# Patient Record
Sex: Male | Born: 1955
Health system: Southern US, Community
[De-identification: ages and names within clinical notes are randomized; demographics above are authoritative.]

## PROBLEM LIST (undated history)

## (undated) DIAGNOSIS — K219 Gastro-esophageal reflux disease without esophagitis: Secondary | ICD-10-CM

## (undated) DIAGNOSIS — M1990 Unspecified osteoarthritis, unspecified site: Secondary | ICD-10-CM

## (undated) DIAGNOSIS — Z8619 Personal history of other infectious and parasitic diseases: Secondary | ICD-10-CM

## (undated) DIAGNOSIS — I2510 Atherosclerotic heart disease of native coronary artery without angina pectoris: Secondary | ICD-10-CM

## (undated) DIAGNOSIS — N289 Disorder of kidney and ureter, unspecified: Secondary | ICD-10-CM

## (undated) DIAGNOSIS — E785 Hyperlipidemia, unspecified: Secondary | ICD-10-CM

## (undated) DIAGNOSIS — I219 Acute myocardial infarction, unspecified: Secondary | ICD-10-CM

## (undated) DIAGNOSIS — I10 Essential (primary) hypertension: Secondary | ICD-10-CM

## (undated) DIAGNOSIS — N200 Calculus of kidney: Secondary | ICD-10-CM

## (undated) DIAGNOSIS — Z87442 Personal history of urinary calculi: Secondary | ICD-10-CM

## (undated) HISTORY — DX: Hyperlipidemia, unspecified: E78.5

## (undated) HISTORY — PX: CORONARY ANGIOPLASTY: SHX604

## (undated) HISTORY — PX: APPENDECTOMY: SHX54

## (undated) HISTORY — PX: CYSTOSCOPY WITH STENT PLACEMENT: SHX5790

## (undated) HISTORY — PX: BACK SURGERY: SHX140

## (undated) HISTORY — PX: TONSILLECTOMY: SUR1361

---
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
## 1999-03-05 ENCOUNTER — Emergency Department (HOSPITAL_COMMUNITY): Admission: EM | Admit: 1999-03-05 | Discharge: 1999-03-05 | Payer: Self-pay | Admitting: Emergency Medicine

## 2000-11-13 ENCOUNTER — Emergency Department (HOSPITAL_COMMUNITY): Admission: EM | Admit: 2000-11-13 | Discharge: 2000-11-13 | Payer: Self-pay | Admitting: Emergency Medicine

## 2001-09-27 ENCOUNTER — Emergency Department (HOSPITAL_COMMUNITY): Admission: EM | Admit: 2001-09-27 | Discharge: 2001-09-28 | Payer: Self-pay | Admitting: Emergency Medicine

## 2001-09-28 ENCOUNTER — Encounter: Payer: Self-pay | Admitting: Emergency Medicine

## 2001-11-11 ENCOUNTER — Ambulatory Visit (HOSPITAL_BASED_OUTPATIENT_CLINIC_OR_DEPARTMENT_OTHER): Admission: RE | Admit: 2001-11-11 | Discharge: 2001-11-11 | Payer: Self-pay | Admitting: Urology

## 2003-04-27 ENCOUNTER — Ambulatory Visit (HOSPITAL_COMMUNITY): Admission: RE | Admit: 2003-04-27 | Discharge: 2003-04-27 | Payer: Self-pay | Admitting: Gastroenterology

## 2005-03-24 HISTORY — PX: BACK SURGERY: SHX140

## 2005-08-01 ENCOUNTER — Ambulatory Visit (HOSPITAL_COMMUNITY): Admission: RE | Admit: 2005-08-01 | Discharge: 2005-08-01 | Payer: Self-pay | Admitting: Neurosurgery

## 2009-03-24 DIAGNOSIS — I219 Acute myocardial infarction, unspecified: Secondary | ICD-10-CM | POA: Insufficient documentation

## 2009-03-24 HISTORY — PX: CORONARY STENT PLACEMENT: SHX1402

## 2009-03-24 HISTORY — DX: Acute myocardial infarction, unspecified: I21.9

## 2011-03-10 ENCOUNTER — Telehealth (INDEPENDENT_AMBULATORY_CARE_PROVIDER_SITE_OTHER): Payer: Self-pay | Admitting: General Surgery

## 2011-03-10 NOTE — Telephone Encounter (Signed)
Disregard prior encounter notated. Placed in chart in error. DOB from caller does not match this chart.

## 2011-03-10 NOTE — Telephone Encounter (Signed)
Pt called in stating over a month ago he had a fill and has not been feeling well. Pt was placed on hold to pull up info and he hung up before any appointment or further information could be obtained.

## 2014-07-15 ENCOUNTER — Encounter (HOSPITAL_BASED_OUTPATIENT_CLINIC_OR_DEPARTMENT_OTHER): Payer: Self-pay

## 2014-07-15 ENCOUNTER — Emergency Department (HOSPITAL_BASED_OUTPATIENT_CLINIC_OR_DEPARTMENT_OTHER): Payer: 59

## 2014-07-15 ENCOUNTER — Emergency Department (HOSPITAL_BASED_OUTPATIENT_CLINIC_OR_DEPARTMENT_OTHER)
Admission: EM | Admit: 2014-07-15 | Discharge: 2014-07-15 | Disposition: A | Discharge status: Home / Self Care | Payer: 59 | Attending: Emergency Medicine | Admitting: Emergency Medicine

## 2014-07-15 DIAGNOSIS — I252 Old myocardial infarction: Secondary | ICD-10-CM | POA: Insufficient documentation

## 2014-07-15 DIAGNOSIS — I2510 Atherosclerotic heart disease of native coronary artery without angina pectoris: Secondary | ICD-10-CM | POA: Insufficient documentation

## 2014-07-15 DIAGNOSIS — R110 Nausea: Secondary | ICD-10-CM | POA: Diagnosis not present

## 2014-07-15 DIAGNOSIS — Z9089 Acquired absence of other organs: Secondary | ICD-10-CM | POA: Diagnosis not present

## 2014-07-15 DIAGNOSIS — R6883 Chills (without fever): Secondary | ICD-10-CM | POA: Insufficient documentation

## 2014-07-15 DIAGNOSIS — Z87442 Personal history of urinary calculi: Secondary | ICD-10-CM | POA: Diagnosis not present

## 2014-07-15 DIAGNOSIS — I10 Essential (primary) hypertension: Secondary | ICD-10-CM | POA: Diagnosis not present

## 2014-07-15 DIAGNOSIS — R109 Unspecified abdominal pain: Secondary | ICD-10-CM | POA: Diagnosis not present

## 2014-07-15 DIAGNOSIS — Z720 Tobacco use: Secondary | ICD-10-CM | POA: Insufficient documentation

## 2014-07-15 DIAGNOSIS — Z87448 Personal history of other diseases of urinary system: Secondary | ICD-10-CM | POA: Diagnosis not present

## 2014-07-15 HISTORY — DX: Calculus of kidney: N20.0

## 2014-07-15 HISTORY — DX: Disorder of kidney and ureter, unspecified: N28.9

## 2014-07-15 HISTORY — DX: Essential (primary) hypertension: I10

## 2014-07-15 HISTORY — DX: Atherosclerotic heart disease of native coronary artery without angina pectoris: I25.10

## 2014-07-15 HISTORY — DX: Acute myocardial infarction, unspecified: I21.9

## 2014-07-15 LAB — URINALYSIS, ROUTINE W REFLEX MICROSCOPIC
BILIRUBIN URINE: NEGATIVE
Glucose, UA: NEGATIVE mg/dL
Ketones, ur: 15 mg/dL — AB
Nitrite: NEGATIVE
Protein, ur: 30 mg/dL — AB
Specific Gravity, Urine: 1.027 (ref 1.005–1.030)
Urobilinogen, UA: 0.2 mg/dL (ref 0.0–1.0)
pH: 5.5 (ref 5.0–8.0)

## 2014-07-15 LAB — URINE MICROSCOPIC-ADD ON

## 2014-07-15 MED ORDER — KETOROLAC TROMETHAMINE 30 MG/ML IJ SOLN
30.0000 mg | Freq: Once | INTRAMUSCULAR | Status: AC
  Administered 2014-07-15: 30 mg via INTRAVENOUS
  Filled 2014-07-15: qty 1

## 2014-07-15 MED ORDER — TAMSULOSIN HCL 0.4 MG PO CAPS: 0.4000 mg | ORAL_CAPSULE | Freq: Every day | ORAL | Status: DC

## 2014-07-15 MED ORDER — HYDROMORPHONE HCL 1 MG/ML IJ SOLN
1.0000 mg | Freq: Once | INTRAMUSCULAR | Status: AC
  Administered 2014-07-15: 1 mg via INTRAVENOUS
  Filled 2014-07-15: qty 1

## 2014-07-15 MED ORDER — SODIUM CHLORIDE 0.9 % IV BOLUS (SEPSIS)
1000.0000 mL | Freq: Once | INTRAVENOUS | Status: AC
  Administered 2014-07-15: 1000 mL via INTRAVENOUS

## 2014-07-15 MED ORDER — HYDROCODONE-ACETAMINOPHEN 5-325 MG PO TABS: 1.0000 | ORAL_TABLET | ORAL | Status: DC | PRN

## 2014-07-15 NOTE — ED Notes (Signed)
PT reports left flank pain x3 hours with nausea, reports history of renal stones with dark urine today.

## 2014-07-15 NOTE — ED Provider Notes (Signed)
CSN: [id number]     Arrival date & time 07/15/14  1525 History  This chart was scribed for Gerhard Munch, MD by [place] Peace, ED Scribe. The patient was seen in MH11/[facility]11. The patient's care was started at 5:46 PM.     Chief Complaint  Patient presents with  . Flank Pain      Patient is a 59 y.o. male presenting with flank pain. The history is provided by the patient. No language interpreter was used.  Flank Pain This is a new problem. The current episode started 3 to 5 hours ago. The problem occurs constantly. The problem has been gradually worsening.  HPI Comments: Darryl Ramsey is a 59 y.o. male who presents to the Emergency Department complaining of non-radiating left flank pain onset 4-5 hrs ago that has worsened within the past 2 hrs. Pt also complains of nausea and chills. He notes new "dark" color change in urine as well. No complaints of fever, vomiting, diarrhea. History of kidney stones in the past (most recent episode- 1 year ago). History of heart attack and hypertension.    Past Medical History  Diagnosis Date  . Coronary artery disease   . Hypertension   . Myocardial infarct   . Renal disorder   . Renal calculi    Past Surgical History  Procedure Laterality Date  . Appendectomy    . Tonsillectomy    . Back surgery     History reviewed. No pertinent family history. History  Substance Use Topics  . Smoking status: Current Some Day Smoker  . Smokeless tobacco: Not on file  . Alcohol Use: No    Review of Systems  Constitutional: Positive for chills. Negative for fever.       Per HPI, otherwise negative  HENT:       Per HPI, otherwise negative  Respiratory:       Per HPI, otherwise negative  Cardiovascular:       Per HPI, otherwise negative  Gastrointestinal: Positive for nausea. Negative for vomiting.  Endocrine:       Negative aside from HPI  Genitourinary: Positive for flank pain.       "Dark" color change in urine. Neg aside from HPI    Musculoskeletal:       Per HPI, otherwise negative  Skin: Negative.   Neurological: Negative for syncope.      Allergies  Review of patient's allergies indicates no known allergies.  Home Medications   Prior to Admission medications   Not on File   BP 179/94 mmHg  Pulse 72  Temp(Src) 97.8 F (36.6 C) (Oral)  Resp 22  Ht 6' (1.829 m)  Wt 228 lb (103.42 kg)  BMI 30.92 kg/m2  SpO2 98% Physical Exam  Constitutional: He is oriented to person, place, and time. He appears well-developed. No distress.  HENT:  Head: Normocephalic and atraumatic.  Eyes: Conjunctivae and EOM are normal.  Cardiovascular: Normal rate, regular rhythm and normal heart sounds.  Exam reveals no gallop and no friction rub.   No murmur heard. Pulmonary/Chest: Effort normal and breath sounds normal. No stridor. No respiratory distress.  Abdominal: He exhibits no distension. There is no tenderness.  Musculoskeletal: He exhibits no edema.  Neurological: He is alert and oriented to person, place, and time.  Skin: Skin is warm and dry.  Psychiatric: He has a normal mood and affect.  Nursing note and vitals reviewed.   ED Course  Procedures (including critical care time) Labs Review Labs Reviewed  URINALYSIS, ROUTINE W REFLEX MICROSCOPIC - Abnormal; Notable for the following:    Color, Urine AMBER (*)    APPearance CLOUDY (*)    Hgb urine dipstick LARGE (*)    Ketones, ur 15 (*)    Protein, ur 30 (*)    Leukocytes, UA TRACE (*)    All other components within normal limits  URINE MICROSCOPIC-ADD ON - Abnormal; Notable for the following:    Bacteria, UA FEW (*)    Crystals CA OXALATE CRYSTALS (*)    All other components within normal limits    Imaging Review US Renal  07/15/2014   CLINICAL DATA:  LEFT flank pain. Sudden onset at 12:30 p.m. today. Urolithiasis.  EXAM: RENAL/URINARY TRACT ULTRASOUND COMPLETE  COMPARISON:  None.  FINDINGS: Right Kidney:  Length: 10.2 cm. Negative for hydronephrosis.  No calculi. Small hypoechoic area extends off the inferior pole of the kidney measuring 15 mm x 8 mm x 9 mm. This has some internal echoes and probably represents a mildly complex cyst.  Left Kidney:  Length: 11.1 cm. No hydronephrosis. No definite calculi. Hyperechoic lesion is present in the upper pole near a calyx measuring 10 mm x 6 mm x 11 mm. This may represent a proteinaceous or debris-filled cyst.  Bladder:  Bladder is partially collapsed.  Ureteral jets are visible.  IMPRESSION: No visible urolithiasis or hydronephrosis. Mildly complex cystic lesions in both kidneys. Follow-up 6 month renal ultrasound recommended to assess for stability.   Electronically Signed   By: Andreas Newport M.D.   On: 07/15/2014 18:43    Medications  sodium chloride 0.9 % bolus 1,000 mL (1,000 mLs Intravenous New Bag/Given 07/15/14 1754)  ketorolac (TORADOL) 30 MG/ML injection 30 mg (30 mg Intravenous Given 07/15/14 1754)  HYDROmorphone (DILAUDID) injection 1 mg (1 mg Intravenous Given 07/15/14 1755)    5:48 PM- Treatment plan was discussed with patient who verbalizes understanding and agrees.    7:11 PM Patient resting calm only. We discussed all findings. He will follow-up with urology.  MDM    I personally performed the services described in this documentation, which was scribed in my presence. The recorded information has been reviewed and is accurate.   This patient with a history of kidney stones presents after the acute onset of left-sided flank pain. Given the patient's prior diagnosis, ultrasound was performed in addition to urinalysis. Patient received fluid resuscitation, analgesics, anti-inflammatories per Pain resolved entirely. No evidence for ongoing infection, complete obstruction. Ultrasound did not demonstrate obvious stone, but given the hematuria, the pain, kidney stone is likely causative. Patient discharged in stable condition to follow-up urology.   Gerhard Munch, MD 07/15/14  [phone]

## 2014-07-15 NOTE — ED Notes (Signed)
Spoke w/ pt in waiting area, asked pt to attempt to void to obtain urine specimen to help expedite his care. Pt walking around in waiting room. Pt agreed w/ current care being provided

## 2014-07-15 NOTE — Discharge Instructions (Signed)
As discussed, your evaluation has demonstrated the likely presence of a kidney stone causing your flank pain. It is important to take all medication as directed, monitor your condition carefully, and be sure to return here for concerning changes in your condition.

## 2014-07-17 ENCOUNTER — Emergency Department (HOSPITAL_BASED_OUTPATIENT_CLINIC_OR_DEPARTMENT_OTHER): Payer: 59

## 2014-07-17 ENCOUNTER — Emergency Department (HOSPITAL_BASED_OUTPATIENT_CLINIC_OR_DEPARTMENT_OTHER)
Admission: EM | Admit: 2014-07-17 | Discharge: 2014-07-18 | Disposition: A | Discharge status: Home / Self Care | Payer: 59 | Attending: Emergency Medicine | Admitting: Emergency Medicine

## 2014-07-17 ENCOUNTER — Encounter (HOSPITAL_BASED_OUTPATIENT_CLINIC_OR_DEPARTMENT_OTHER): Payer: Self-pay | Admitting: *Deleted

## 2014-07-17 DIAGNOSIS — Z87442 Personal history of urinary calculi: Secondary | ICD-10-CM | POA: Insufficient documentation

## 2014-07-17 DIAGNOSIS — Z79899 Other long term (current) drug therapy: Secondary | ICD-10-CM | POA: Insufficient documentation

## 2014-07-17 DIAGNOSIS — R109 Unspecified abdominal pain: Secondary | ICD-10-CM | POA: Diagnosis present

## 2014-07-17 DIAGNOSIS — I252 Old myocardial infarction: Secondary | ICD-10-CM | POA: Insufficient documentation

## 2014-07-17 DIAGNOSIS — N23 Unspecified renal colic: Secondary | ICD-10-CM | POA: Diagnosis not present

## 2014-07-17 DIAGNOSIS — Z720 Tobacco use: Secondary | ICD-10-CM | POA: Insufficient documentation

## 2014-07-17 DIAGNOSIS — I2510 Atherosclerotic heart disease of native coronary artery without angina pectoris: Secondary | ICD-10-CM | POA: Diagnosis not present

## 2014-07-17 DIAGNOSIS — I10 Essential (primary) hypertension: Secondary | ICD-10-CM | POA: Insufficient documentation

## 2014-07-17 LAB — URINE MICROSCOPIC-ADD ON

## 2014-07-17 LAB — URINALYSIS, ROUTINE W REFLEX MICROSCOPIC
Bilirubin Urine: NEGATIVE
Glucose, UA: NEGATIVE mg/dL
Ketones, ur: NEGATIVE mg/dL
LEUKOCYTES UA: NEGATIVE
Nitrite: NEGATIVE
Protein, ur: NEGATIVE mg/dL
Specific Gravity, Urine: 1.031 — ABNORMAL HIGH (ref 1.005–1.030)
Urobilinogen, UA: 1 mg/dL (ref 0.0–1.0)
pH: 5.5 (ref 5.0–8.0)

## 2014-07-17 MED ORDER — KETOROLAC TROMETHAMINE 15 MG/ML IJ SOLN
15.0000 mg | Freq: Once | INTRAMUSCULAR | Status: AC
  Administered 2014-07-17: 15 mg via INTRAVENOUS
  Filled 2014-07-17: qty 1

## 2014-07-17 MED ORDER — HYDROMORPHONE HCL 1 MG/ML IJ SOLN
1.0000 mg | Freq: Once | INTRAMUSCULAR | Status: AC
  Administered 2014-07-17: 1 mg via INTRAVENOUS
  Filled 2014-07-17: qty 1

## 2014-07-17 MED ORDER — ONDANSETRON HCL 4 MG/2ML IJ SOLN
4.0000 mg | Freq: Once | INTRAMUSCULAR | Status: AC
  Administered 2014-07-17: 4 mg via INTRAVENOUS
  Filled 2014-07-17: qty 2

## 2014-07-17 MED ORDER — SODIUM CHLORIDE 0.9 % IV SOLN
INTRAVENOUS | Status: DC
  Administered 2014-07-17: 1000 mL via INTRAVENOUS

## 2014-07-17 NOTE — ED Notes (Signed)
Left flank pain. Possible kidney stone.

## 2014-07-17 NOTE — ED Notes (Signed)
Left flank pain onset 2130 this pm,  Was seen Saturday for same

## 2014-07-17 NOTE — ED Provider Notes (Addendum)
CSN: [id number]     Arrival date & time 07/17/14  2151 History  This chart was scribed for Paula Libra, MD by Ronney Lion, ED Scribe. This patient was seen in room MH06/MH06 and the patient's care was started at 11:22 PM.    Chief Complaint  Patient presents with  . Flank Pain   The history is provided by a relative. No language interpreter was used.    HPI Comments: Darryl Ramsey is a 59 y.o. male who presents to the Emergency Department complaining of constant, severe, non-radiating left flank pain that began about 3 hours ago. He has had some associated nausea but no vomiting. He denies fever or gross hematuria. Per daughter, patient was evaluated in the ED about 2 days ago for the same symptoms that began the same day. A renal ultrasound was nondiagnostic and he was discharged with Flomax and 10 tablets of Vicodin, which provided no relief. He was also told to follow-up with [facility], but his daughter called them and they are unable to see him until next week.   Past Medical History  Diagnosis Date  . Coronary artery disease   . Hypertension   . Myocardial infarct   . Renal disorder   . Renal calculi    Past Surgical History  Procedure Laterality Date  . Appendectomy    . Tonsillectomy    . Back surgery     No family history on file. History  Substance Use Topics  . Smoking status: Current Some Day Smoker  . Smokeless tobacco: Not on file  . Alcohol Use: No    Review of Systems  A complete 10 system review of systems was obtained and all systems are negative except as noted in the HPI and PMH.    Allergies  Review of patient's allergies indicates no known allergies.  Home Medications   Prior to Admission medications   Medication Sig Start Date End Date Taking? Authorizing Provider  HYDROcodone-acetaminophen (NORCO/VICODIN) 5-325 MG per tablet Take 1 tablet by mouth every 4 (four) hours as needed for severe pain. 07/15/14   Gerhard Munch, MD  tamsulosin  (FLOMAX) 0.4 MG CAPS capsule Take 1 capsule (0.4 mg total) by mouth daily. 07/15/14   Gerhard Munch, MD   BP 155/95 mmHg  Pulse 86  Temp(Src) 97.7 F (36.5 C) (Oral)  Resp 16  Ht 6' (1.829 m)  Wt 228 lb (103.42 kg)  BMI 30.92 kg/m2  SpO2 96%   Physical Exam  Nursing note and vitals reviewed. General: Well-developed, well-nourished male in apparent discomfort; appearance consistent with age of record HENT: normocephalic; atraumatic Eyes: pupils equal, round and reactive to light; extraocular muscles intact Neck: supple Heart: regular rate and rhythm Lungs: clear to auscultation bilaterally Abdomen: soft; nondistended; nontender; no masses or hepatosplenomegaly; bowel sounds present Genitourinary: Mild left CVA tenderness Extremities: No deformity; full range of motion; pulses normal Neurologic: Awake, alert and oriented; motor function intact in all extremities and symmetric; no facial droop Skin: Warm and dry Psychiatric: Normal mood and affect  ED Course  Procedures (including critical care time)  DIAGNOSTIC STUDIES: Oxygen Saturation is 96% on room air, normal by my interpretation.    COORDINATION OF CARE: 11:27 PM - Discussed treatment plan with pt at bedside which includes pain medications and CT scan, and pt agreed to plan.   MDM   Nursing notes and vitals signs, including pulse oximetry, reviewed.  Summary of this visit's results, reviewed by myself:  Labs:  Results for  orders placed or performed during the hospital encounter of 07/17/14 (from the past 24 hour(s))  Urinalysis, Routine w reflex microscopic     Status: Abnormal   Collection Time: 07/17/14 10:00 PM  Result Value Ref Range   Color, Urine YELLOW YELLOW   APPearance CLEAR CLEAR   Specific Gravity, Urine 1.031 (H) 1.005 - 1.030   pH 5.5 5.0 - 8.0   Glucose, UA NEGATIVE NEGATIVE mg/dL   Hgb urine dipstick MODERATE (A) NEGATIVE   Bilirubin Urine NEGATIVE NEGATIVE   Ketones, ur NEGATIVE NEGATIVE  mg/dL   Protein, ur NEGATIVE NEGATIVE mg/dL   Urobilinogen, UA 1.0 0.0 - 1.0 mg/dL   Nitrite NEGATIVE NEGATIVE   Leukocytes, UA NEGATIVE NEGATIVE  Urine microscopic-add on     Status: None   Collection Time: 07/17/14 10:00 PM  Result Value Ref Range   Squamous Epithelial / LPF RARE RARE   RBC / HPF 7-10 <3 RBC/hpf   Bacteria, UA RARE RARE    Imaging Studies: Ct Renal Stone Study  07/18/2014   CLINICAL DATA:  LEFT flank pain for 3 hours, hematuria. History of appendectomy, cholecystectomy, renal disorder and renal calculi.  EXAM: CT ABDOMEN AND PELVIS WITHOUT CONTRAST  TECHNIQUE: Multidetector CT imaging of the abdomen and pelvis was performed following the standard protocol without IV contrast.  COMPARISON:  Renal ultrasound July 15, 2014  FINDINGS: LUNG BASES: Included view of the lung bases demonstrate dependent atelectasis. The visualized heart and pericardium are unremarkable.  KIDNEYS/BLADDER: Kidneys are orthotopic, demonstrating normal size and morphology. Mild LEFT hydroureteronephrosis to the level of the distal ureter were 2 calculi are seen, largest measuring 6 mm. Residual 3 mm LEFT lower pole nephrolithiasis. Limited assessment for renal masses on this nonenhanced examination. Exophytic 16 mm RIGHT interpolar cyst. The unopacified ureters are normal in course and caliber. Urinary bladder is partially distended and unremarkable.  SOLID ORGANS: The liver, spleen, gallbladder, pancreas and adrenal glands are unremarkable for this non-contrast examination.  GASTROINTESTINAL TRACT: Tiny hiatal hernia. The stomach, small and large bowel are normal in course and caliber without inflammatory changes, the sensitivity may be decreased by lack of enteric contrast. Moderate descending colonic and sigmoid diverticulosis. Nonvisualized appendix consistent with provided surgical history.  PERITONEUM/RETROPERITONEUM: Aortoiliac vessels are normal in course and overall caliber with mild ectasia of the  infrarenal aorta to 2.3 cm, mild to moderate calcific atherosclerosis. Mild misty mesentery with small lymph nodes are likely reactive. No lymphadenopathy by CT size criteria. Prostate size appears normal. No intraperitoneal free fluid nor free air.  SOFT TISSUES/ OSSEOUS STRUCTURES: Nonsuspicious. Moderate wide necked fat containing umbilical hernia. Moderate LEFT greater than RIGHT fat containing inguinal hernias. Moderate to severe degenerative change of the RIGHT hip. Moderate to severe L5-S1 degenerative disc with resultant moderate to severe RIGHT L5-S1 neural foraminal narrowing.  IMPRESSION: Mild LEFT hydroureteronephrosis to the level of the distal ureter were 2 calculi are seen measuring up to 6 mm. Residual 3 mm LEFT lower pole nephrolithiasis.  Moderate colonic diverticulosis without CT findings of acute diverticulitis.   Electronically Signed   By: Awilda Metro   On: 07/18/2014 00:56   1:00 AM Pain well controlled with IV medications.  I personally performed the services described in this documentation, which was scribed in my presence. The recorded information has been reviewed and is accurate.   Paula Libra, MD 07/18/14 [id number]  Paula Libra, MD 07/18/14 [id number]

## 2014-07-18 ENCOUNTER — Other Ambulatory Visit: Payer: Self-pay | Admitting: Urology

## 2014-07-18 MED ORDER — HYDROMORPHONE HCL 4 MG PO TABS: 2.0000 mg | ORAL_TABLET | ORAL | Status: DC | PRN

## 2014-07-18 MED ORDER — ONDANSETRON 8 MG PO TBDP: 8.0000 mg | ORAL_TABLET | Freq: Three times a day (TID) | ORAL | Status: DC | PRN

## 2014-07-21 ENCOUNTER — Encounter (HOSPITAL_COMMUNITY): Payer: Self-pay | Admitting: *Deleted

## 2014-07-24 ENCOUNTER — Encounter (HOSPITAL_COMMUNITY): Payer: Self-pay | Admitting: General Practice

## 2014-07-24 ENCOUNTER — Encounter (HOSPITAL_COMMUNITY)
Admission: RE | Disposition: A | Discharge status: Home / Self Care | Payer: Self-pay | Source: Ambulatory Visit | Attending: Urology

## 2014-07-24 ENCOUNTER — Ambulatory Visit (HOSPITAL_COMMUNITY): Payer: 59

## 2014-07-24 ENCOUNTER — Ambulatory Visit (HOSPITAL_COMMUNITY)
Admission: RE | Admit: 2014-07-24 | Discharge: 2014-07-24 | Disposition: A | Discharge status: Home / Self Care | Payer: 59 | Source: Ambulatory Visit | Attending: Urology | Admitting: Urology

## 2014-07-24 DIAGNOSIS — N201 Calculus of ureter: Secondary | ICD-10-CM | POA: Diagnosis present

## 2014-07-24 DIAGNOSIS — Z9049 Acquired absence of other specified parts of digestive tract: Secondary | ICD-10-CM | POA: Diagnosis not present

## 2014-07-24 DIAGNOSIS — I252 Old myocardial infarction: Secondary | ICD-10-CM | POA: Insufficient documentation

## 2014-07-24 DIAGNOSIS — I10 Essential (primary) hypertension: Secondary | ICD-10-CM | POA: Diagnosis not present

## 2014-07-24 DIAGNOSIS — I2510 Atherosclerotic heart disease of native coronary artery without angina pectoris: Secondary | ICD-10-CM | POA: Insufficient documentation

## 2014-07-24 DIAGNOSIS — F17210 Nicotine dependence, cigarettes, uncomplicated: Secondary | ICD-10-CM | POA: Insufficient documentation

## 2014-07-24 DIAGNOSIS — Z955 Presence of coronary angioplasty implant and graft: Secondary | ICD-10-CM | POA: Insufficient documentation

## 2014-07-24 SURGERY — LITHOTRIPSY, ESWL
Anesthesia: LOCAL | Laterality: Left

## 2014-07-24 MED ORDER — DIPHENHYDRAMINE HCL 25 MG PO CAPS
25.0000 mg | ORAL_CAPSULE | ORAL | Status: AC
  Administered 2014-07-24: 25 mg via ORAL
  Filled 2014-07-24: qty 1

## 2014-07-24 MED ORDER — CIPROFLOXACIN HCL 500 MG PO TABS
500.0000 mg | ORAL_TABLET | ORAL | Status: AC
  Administered 2014-07-24: 500 mg via ORAL
  Filled 2014-07-24: qty 1

## 2014-07-24 MED ORDER — DIAZEPAM 5 MG PO TABS
10.0000 mg | ORAL_TABLET | ORAL | Status: AC
  Administered 2014-07-24: 10 mg via ORAL
  Filled 2014-07-24: qty 2

## 2014-07-24 MED ORDER — SODIUM CHLORIDE 0.9 % IV SOLN
INTRAVENOUS | Status: DC
  Administered 2014-07-24: 13:00:00 via INTRAVENOUS

## 2014-07-24 NOTE — Brief Op Note (Signed)
07/24/2014  2:27 PM  PATIENT:  Darryl Ramsey  59 y.o. male  PRE-OPERATIVE DIAGNOSIS:  left ureteral stone   POST-OPERATIVE DIAGNOSIS:  * No post-op diagnosis entered *  PROCEDURE:  Procedure(s): LEFT EXTRACORPOREAL SHOCK WAVE LITHOTRIPSY (ESWL) (Left)  SURGEON:  Surgeon(s) and Role:    * Sebastian Ache, MD - Primary  PHYSICIAN ASSISTANT:   ASSISTANTS: none   ANESTHESIA:   MAC  EBL:     BLOOD ADMINISTERED:none  DRAINS: none   LOCAL MEDICATIONS USED:  NONE  SPECIMEN:  No Specimen  DISPOSITION OF SPECIMEN:  N/A  COUNTS:  YES  TOURNIQUET:  * No tourniquets in log *  DICTATION: .Note written in paper chart  PLAN OF CARE: Discharge to home after PACU  PATIENT DISPOSITION:  Short Stay   Delay start of Pharmacological VTE agent (>24hrs) due to surgical blood loss or risk of bleeding: yes

## 2014-07-24 NOTE — H&P (Signed)
Darryl Ramsey is an 59 y.o. male.    Chief Complaint: Pre-OP Left Shockwave Lithotripsy  HPI:   1 - Left Ureteral Stone - Left 49mm distal stone by ER CT on eval colicky flank pain. Stone is 46mm, 11cm SSD, 700HU and visible on scout images just above left femoral head most medial calcification.  NO interval fevers or stone passage.  Today Yanni presents for elective left shockwave lithotripsy. He normally follows with Dr. Marlou Porch.   Past Medical History  Diagnosis Date  . Coronary artery disease   . Hypertension   . Myocardial infarct   . Renal disorder     left ureteral stone  . Renal calculi     Past Surgical History  Procedure Laterality Date  . Appendectomy    . Tonsillectomy    . Back surgery    . Coronary stent placement  2011    patient says he had an MI and coronary stent 2011. No coronary problems since  . Cystoscopy with stent placement  2006 and in 80's    No family history on file. Social History:  reports that he has been smoking Cigarettes.  He does not have any smokeless tobacco history on file. He reports that he drinks alcohol. He reports that he does not use illicit drugs.  Allergies: No Known Allergies  No prescriptions prior to admission    No results found for this or any previous visit (from the past 48 hour(s)). No results found.  Review of Systems  Constitutional: Negative.  Negative for fever, chills and malaise/fatigue.  HENT: Negative.   Eyes: Negative.   Respiratory: Negative.   Cardiovascular: Negative.   Gastrointestinal: Negative.   Genitourinary: Positive for flank pain.  Musculoskeletal: Negative.   Skin: Negative.   Neurological: Negative.   Endo/Heme/Allergies: Negative.   Psychiatric/Behavioral: Negative.     There were no vitals taken for this visit. Physical Exam  Constitutional: He appears well-developed.  HENT:  Head: Normocephalic.  Eyes: Pupils are equal, round, and reactive to light.  Neck: Normal range of  motion.  Cardiovascular: Normal rate.   Respiratory: Effort normal.  GI: Soft.  Genitourinary:  Mild left CVAT  Musculoskeletal: Normal range of motion.  Neurological: He is alert.  Skin: Skin is warm.  Psychiatric: He has a normal mood and affect. His behavior is normal. Judgment and thought content normal.     Assessment/Plan  1 - Left Ureteral Stone - risks and benefits previousl discussed last office visity with Dr. Marlou Porch.  Also, we discussed shockwave lithotripsy in detail as well as my "rule of 9s" with stones <33mm, less than 900 HU, and skin to stone distance <9cm having approximately 90% treatment success with single session of treatment. We then addressed how stones that are larger, more dense, and in patients with less favorable anatomy have incrementally decreased success rates. We discussed risks including, bleeding, infection, hematoma, loss of kidney, need for staged therapy, need for adjunctive therapy and requirement to refrain from any anticoagulants, anti-platelet or aspirin-like products peri-procedureally. After careful consideration, the patient has chosen to proceed.   William Schake 07/24/2014, 5:58 AM

## 2014-07-24 NOTE — Discharge Instructions (Signed)
1 - You may have urinary urgency (bladder spasms), bloody urine on / off, and pass small stone fragments for up to 2 weeks. This is normal. ° °2 - Call MD or go to ER for fever >102, severe pain / nausea / vomiting not relieved by medications, or acute change in medical status ° °

## 2014-12-04 ENCOUNTER — Emergency Department (HOSPITAL_BASED_OUTPATIENT_CLINIC_OR_DEPARTMENT_OTHER)
Admission: EM | Admit: 2014-12-04 | Discharge: 2014-12-04 | Disposition: A | Discharge status: Home / Self Care | Payer: 59 | Attending: Emergency Medicine | Admitting: Emergency Medicine

## 2014-12-04 ENCOUNTER — Encounter (HOSPITAL_BASED_OUTPATIENT_CLINIC_OR_DEPARTMENT_OTHER): Payer: Self-pay | Admitting: *Deleted

## 2014-12-04 DIAGNOSIS — R251 Tremor, unspecified: Secondary | ICD-10-CM | POA: Diagnosis not present

## 2014-12-04 DIAGNOSIS — I252 Old myocardial infarction: Secondary | ICD-10-CM | POA: Insufficient documentation

## 2014-12-04 DIAGNOSIS — T43615A Adverse effect of caffeine, initial encounter: Secondary | ICD-10-CM | POA: Insufficient documentation

## 2014-12-04 DIAGNOSIS — Z87448 Personal history of other diseases of urinary system: Secondary | ICD-10-CM | POA: Diagnosis not present

## 2014-12-04 DIAGNOSIS — G8929 Other chronic pain: Secondary | ICD-10-CM | POA: Diagnosis not present

## 2014-12-04 DIAGNOSIS — I10 Essential (primary) hypertension: Secondary | ICD-10-CM | POA: Insufficient documentation

## 2014-12-04 DIAGNOSIS — Z79899 Other long term (current) drug therapy: Secondary | ICD-10-CM | POA: Diagnosis not present

## 2014-12-04 DIAGNOSIS — Z87442 Personal history of urinary calculi: Secondary | ICD-10-CM | POA: Insufficient documentation

## 2014-12-04 DIAGNOSIS — Z720 Tobacco use: Secondary | ICD-10-CM | POA: Diagnosis not present

## 2014-12-04 DIAGNOSIS — I2510 Atherosclerotic heart disease of native coronary artery without angina pectoris: Secondary | ICD-10-CM | POA: Diagnosis not present

## 2014-12-04 DIAGNOSIS — R42 Dizziness and giddiness: Secondary | ICD-10-CM | POA: Diagnosis present

## 2014-12-04 LAB — CBC WITH DIFFERENTIAL/PLATELET
BASOS PCT: 1 % (ref 0–1)
Basophils Absolute: 0.1 10*3/uL (ref 0.0–0.1)
Eosinophils Absolute: 0.2 10*3/uL (ref 0.0–0.7)
Eosinophils Relative: 2 % (ref 0–5)
HEMATOCRIT: 39.2 % (ref 39.0–52.0)
HEMOGLOBIN: 13.4 g/dL (ref 13.0–17.0)
LYMPHS ABS: 1.5 10*3/uL (ref 0.7–4.0)
Lymphocytes Relative: 16 % (ref 12–46)
MCH: 31.5 pg (ref 26.0–34.0)
MCHC: 34.2 g/dL (ref 30.0–36.0)
MCV: 92 fL (ref 78.0–100.0)
MONOS PCT: 9 % (ref 3–12)
Monocytes Absolute: 0.9 10*3/uL (ref 0.1–1.0)
NEUTROS ABS: 6.7 10*3/uL (ref 1.7–7.7)
NEUTROS PCT: 72 % (ref 43–77)
Platelets: 249 10*3/uL (ref 150–400)
RBC: 4.26 MIL/uL (ref 4.22–5.81)
RDW: 13 % (ref 11.5–15.5)
WBC: 9.4 10*3/uL (ref 4.0–10.5)

## 2014-12-04 LAB — BASIC METABOLIC PANEL
Anion gap: 9 (ref 5–15)
BUN: 17 mg/dL (ref 6–20)
CHLORIDE: 102 mmol/L (ref 101–111)
CO2: 25 mmol/L (ref 22–32)
CREATININE: 0.98 mg/dL (ref 0.61–1.24)
Calcium: 9 mg/dL (ref 8.9–10.3)
GFR calc non Af Amer: >60 mL/min (ref >60)
Glucose, Bld: 115 mg/dL — ABNORMAL HIGH (ref 65–99)
Potassium: 4.1 mmol/L (ref 3.5–5.1)
Sodium: 136 mmol/L (ref 135–145)

## 2014-12-04 LAB — TSH: TSH: 1.54 u[IU]/mL (ref 0.350–4.500)

## 2014-12-04 LAB — CBG MONITORING, ED: Glucose-Capillary: 108 mg/dL — ABNORMAL HIGH (ref 65–99)

## 2014-12-04 NOTE — ED Notes (Signed)
Pt d/c with ride. States coworker is picking him up. Reports sx have resolved and he has steady gait

## 2014-12-04 NOTE — ED Notes (Signed)
States he took a goody powder this morning and drank coffee. Reports he was feeling "weird and jittery". States felt lightheaded. Had heart attack with stent placement in 2011. Reports Sx improving but still "feel shaky in my hands". Denies chest pain. States did not eat breakfast

## 2014-12-04 NOTE — Discharge Instructions (Signed)
Avoid excessive caffeine.

## 2014-12-04 NOTE — ED Notes (Signed)
MD at bedside. 

## 2014-12-04 NOTE — ED Provider Notes (Signed)
CSN: [id number]     Arrival date & time 12/04/14  4098 History   First MD Initiated Contact with Patient 12/04/14 0930     Chief Complaint  Patient presents with  . Dizziness      HPI  Patient presents after an episode of feeling jittery and shaky this morning.  His chronic right hip pain and he usually takes something for pain in the morning. Sometimes Goody powder sometimes Motrin, sometimes Tylenol. He had 2 cups of coffee this morning and did not eat. 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 Started feeling shaky and weak and lightheaded. Presents here stating that he feels better that he continues to have a mild shakiness in his hands. No chest pain or shortness of breath or other recent symptoms.   Past Medical History  Diagnosis Date  . Coronary artery disease   . Hypertension   . Myocardial infarct   . Renal disorder     left ureteral stone  . Renal calculi    Past Surgical History  Procedure Laterality Date  . Appendectomy    . Tonsillectomy    . Back surgery    . Coronary stent placement  2011    patient says he had an MI and coronary stent 2011. No coronary problems since  . Cystoscopy with stent placement  2006 and in 80's   No family history on file. Social History  Substance Use Topics  . Smoking status: Current Some Day Smoker    Types: Cigarettes  . Smokeless tobacco: Never Used  . Alcohol Use: Yes     Comment: rare    Review of Systems  Constitutional: Negative for fever, chills, diaphoresis, appetite change and fatigue.  HENT: Negative for mouth sores, sore throat and trouble swallowing.   Eyes: Negative for visual disturbance.  Respiratory: Negative for cough, chest tightness, shortness of breath and wheezing.   Cardiovascular: Negative for chest pain.  Gastrointestinal: Negative for nausea, vomiting, abdominal pain, diarrhea and abdominal distention.  Endocrine: Negative for polydipsia, polyphagia and polyuria.   Genitourinary: Negative for dysuria, frequency and hematuria.  Musculoskeletal: Negative for gait problem.  Skin: Negative for color change, pallor and rash.  Neurological: Positive for tremors. Negative for dizziness, syncope, light-headedness and headaches.  Hematological: Does not bruise/bleed easily.  Psychiatric/Behavioral: Negative for behavioral problems and confusion.      Allergies  Review of patient's allergies indicates no known allergies.  Home Medications   Prior to Admission medications   Medication Sig Start Date End Date Taking? Authorizing Provider  atorvastatin (LIPITOR) 40 MG tablet Take 40 mg by mouth every morning.  07/13/14  Yes Historical Provider, MD  lisinopril (PRINIVIL,ZESTRIL) 5 MG tablet Take 5 mg by mouth every morning.  07/16/14  Yes Historical Provider, MD  metoprolol succinate (TOPROL-XL) 25 MG 24 hr tablet Take 25 mg by mouth every morning.  07/13/14  Yes Historical Provider, MD  HYDROmorphone (DILAUDID) 4 MG tablet Take 0.5-1 tablets (2-4 mg total) by mouth every 4 (four) hours as needed for severe pain (for pain). 07/18/14   John Molpus, MD  ondansetron (ZOFRAN ODT) 8 MG disintegrating tablet Take 1 tablet (8 mg total) by mouth every 8 (eight) hours as needed for nausea or vomiting. 07/18/14   Paula Libra, MD  tamsulosin (FLOMAX) 0.4 MG CAPS capsule Take 1 capsule (0.4 mg total) by mouth daily. 07/15/14   Gerhard Munch, MD   BP 127/88 mmHg  Pulse 88  Temp(Src)  97.6 F (36.4 C) (Oral)  Resp 16  Ht 6' (1.829 m)  Wt 230 lb (104.327 kg)  BMI 31.19 kg/m2  SpO2 99% Physical Exam  Constitutional: He is oriented to person, place, and time. He appears well-developed and well-nourished. No distress.  HENT:  Head: Normocephalic.  Eyes: Conjunctivae are normal. Pupils are equal, round, and reactive to light. No scleral icterus.  Neck: Normal range of motion. Neck supple. No thyromegaly present.  Cardiovascular: Normal rate and regular rhythm.  Exam reveals  no gallop and no friction rub.   No murmur heard. Pulmonary/Chest: Effort normal and breath sounds normal. No respiratory distress. He has no wheezes. He has no rales.  Abdominal: Soft. Bowel sounds are normal. He exhibits no distension. There is no tenderness. There is no rebound.  Musculoskeletal: Normal range of motion.  Neurological: He is alert and oriented to person, place, and time.  Skin: Skin is warm and dry. No rash noted.  Psychiatric: He has a normal mood and affect. His behavior is normal.    ED Course  Procedures (including critical care time) Labs Review Labs Reviewed  BASIC METABOLIC PANEL - Abnormal; Notable for the following:    Glucose, Bld 115 (*)    All other components within normal limits  CBG MONITORING, ED - Abnormal; Notable for the following:    Glucose-Capillary 108 (*)    All other components within normal limits  CBC WITH DIFFERENTIAL/PLATELET  TSH    Imaging Review No results found. I have personally reviewed and evaluated these images and lab results as part of my medical decision-making.   EKG Interpretation   Date/Time:  Monday December 04 2014 09:40:13 EDT Ventricular Rate:  85 PR Interval:  178 QRS Duration: 98 QT Interval:  364 QTC Calculation: 433 R Axis:   51 Text Interpretation:  Normal sinus rhythm Normal ECG Confirmed by Fayrene Fearing   MD, Bethsaida Siegenthaler ([id number]) on 12/04/2014 10:48:24 AM      MDM   Final diagnoses:  Caffeine adverse reaction, initial encounter    Symptoms resolved. Normal exam and reassuring studies. Avoid excessive caffeine.    Rolland Porter, MD 12/04/14 [phone]

## 2016-12-12 NOTE — H&P (Deleted)
UNICOMPARTMENTAL KNEE ADMISSION H&P  Patient is being admitted for right medial unicompartmental knee arthroplasty.  Subjective:  Chief Complaint:  Right knee medial compartmental primary OA /pain    HPI: Marykay Lex, 61 year old  male , has a history of pain and functional disability in the right knee and has failed non-surgical conservative treatments for greater than 12 weeks to include NSAID's and/or analgesics, corticosteriod injections, viscosupplementation injections and activity modification.  Onset of symptoms was gradual, starting >10 years ago with gradually worsening course since that time. The patient noted prior procedures on the knee to include  arthroscopy on the right knee(s).  Patient currently rates pain in the right knee(s) at 7 out of 10 with activity. Patient has night pain, worsening of pain with activity and weight bearing, pain that interferes with activities of daily living, pain with passive range of motion, crepitus and joint swelling.  Patient has evidence of periarticular osteophytes and joint space narrowing of the medial compartment by imaging studies.  There is no active infection.  Risks, benefits and expectations were discussed with the patient.  Risks including but not limited to the risk of anesthesia, blood clots, nerve damage, blood vessel damage, failure of the prosthesis, infection and up to and including death.  Patient understand the risks, benefits and expectations and wishes to proceed with surgery.   D/C Plans:       Home  Post-op Meds:       Rx given for ASA, Norco, Robaxin, Iron, Colace and MiraLax  Tranexamic Acid:      To be given - IV / oral  Decadron:      Is to be given  FYI:     ASA  Norco  DME:   Pt already has equipment   PT:   OPPT Rx given   Review of Systems  Constitutional: Negative.   HENT: Negative.   Eyes: Negative.   Respiratory: Negative.   Cardiovascular: Negative.   Gastrointestinal: Negative.   Genitourinary:  Negative.   Musculoskeletal: Positive for joint pain.  Skin: Negative.   Neurological: Negative.   Endo/Heme/Allergies: Negative.   Psychiatric/Behavioral: Negative.      Objective:   Physical Exam  Constitutional: He is well-developed, well-nourished, and in no distress.  HENT:  Head: Normocephalic.  Eyes: Pupils are equal, round, and reactive to light.  Neck: Neck supple. No JVD present. No tracheal deviation present. No thyromegaly present.  Cardiovascular: Normal rate and intact distal pulses.   Pulmonary/Chest: Effort normal and breath sounds normal. No respiratory distress. He has no wheezes.  Abdominal: Soft. There is no tenderness. There is no guarding.  Musculoskeletal:       Right knee: He exhibits decreased range of motion, swelling and bony tenderness. He exhibits no ecchymosis, no deformity, no laceration and no erythema. Tenderness found. Medial joint line tenderness noted. No lateral joint line tenderness noted.  Lymphadenopathy:    He has no cervical adenopathy.  Neurological: He is alert.  Skin: Skin is warm and dry.         Imaging Review Plain radiographs demonstrate severe degenerative joint disease of the right knee(s) medial compartment.  The bone quality appears to be good for age and reported activity level.  Assessment/Plan:  End stage arthritis, right knee medial compartment  The patient history, physical examination, clinical judgment of the provider and imaging studies are consistent with end stage degenerative joint disease of the right knee(s) and medial unicompartmental knee arthroplasty is deemed medically necessary. The  treatment options including medical management, injection therapy arthroscopy and arthroplasty were discussed at length. The risks and benefits of total knee arthroplasty were presented and reviewed. The risks due to aseptic loosening, infection, stiffness, patella tracking problems, thromboembolic complications and other  imponderables were discussed. The patient acknowledged the explanation, agreed to proceed with the plan and consent was signed. Patient is being admitted for outpatient / observation treatment for surgery, pain control, PT, OT, prophylactic antibiotics, VTE prophylaxis, progressive ambulation and ADL's and discharge planning. The patient is planning to be discharged home.    Anastasio Auerbach Icy Fuhrmann   PA-C  12/12/2016, 11:45 AM

## 2016-12-17 NOTE — H&P (Signed)
TOTAL HIP ADMISSION H&P  Patient is admitted for right total hip arthroplasty, anterior approach.  Subjective:  Chief Complaint:   Right hip primary OA / pain  HPI: Darryl Ramsey, 61 y.o. male, has a history of pain and functional disability in the right hip(s) due to arthritis and patient has failed non-surgical conservative treatments for greater than 12 weeks to include NSAID's and/or analgesics, corticosteriod injections and activity modification.  Onset of symptoms was gradual starting ~2 years ago with gradually worsening course since that time.The patient noted no past surgery on the right hip(s).  Patient currently rates pain in the right hip at 8 out of 10 with activity. Patient has night pain, worsening of pain with activity and weight bearing, trendelenberg gait, pain that interfers with activities of daily living and pain with passive range of motion. Patient has evidence of periarticular osteophytes and joint space narrowing by imaging studies. This condition presents safety issues increasing the risk of falls.  There is no current active infection.  Risks, benefits and expectations were discussed with the patient.  Risks including but not limited to the risk of anesthesia, blood clots, nerve damage, blood vessel damage, failure of the prosthesis, infection and up to and including death.  Patient understand the risks, benefits and expectations and wishes to proceed with surgery.   PCP: Darryl Lek, MD  D/C Plans:       Home  Post-op Meds:       No Rx given  Tranexamic Acid:      To be given - IV  Decadron:      Is to be given  FYI:     ASA  Norco  DME:   Rx given for - RW and 3-n-1  PT:   No PT     Past Medical History:  Diagnosis Date  . Coronary artery disease   . Hypertension   . Myocardial infarct   . Renal calculi   . Renal disorder    left ureteral stone    Past Surgical History:  Procedure Laterality Date  . APPENDECTOMY    . BACK SURGERY    .  CORONARY STENT PLACEMENT  2011   patient says he had an MI and coronary stent 2011. No coronary problems since  . CYSTOSCOPY WITH STENT PLACEMENT  2006 and in 80's  . TONSILLECTOMY      No prescriptions prior to admission.   No Known Allergies   Social History  Substance Use Topics  . Smoking status: Current Some Day Smoker    Types: Cigarettes  . Smokeless tobacco: Never Used  . Alcohol use Yes     Comment: rare       Review of Systems  Constitutional: Negative.   HENT: Positive for hearing loss and tinnitus.   Eyes: Negative.   Respiratory: Negative.   Cardiovascular: Negative.   Gastrointestinal: Negative.   Genitourinary: Negative.   Musculoskeletal: Positive for joint pain.  Skin: Negative.   Neurological: Negative.   Endo/Heme/Allergies: Negative.   Psychiatric/Behavioral: Negative.     Objective:  Physical Exam  Constitutional: He is oriented to person, place, and time. He appears well-developed.  HENT:  Head: Normocephalic.  Eyes: Pupils are equal, round, and reactive to light.  Neck: Neck supple. No JVD present. No tracheal deviation present. No thyromegaly present.  Cardiovascular: Normal rate, regular rhythm and intact distal pulses.   Respiratory: Effort normal and breath sounds normal. No respiratory distress. He has no wheezes.  GI: Soft.  There is no tenderness. There is no guarding.  Musculoskeletal:       Right hip: He exhibits decreased range of motion, decreased strength, tenderness and bony tenderness. He exhibits no swelling, no deformity and no laceration.  Lymphadenopathy:    He has no cervical adenopathy.  Neurological: He is alert and oriented to person, place, and time.  Skin: Skin is warm and dry.  Psychiatric: He has a normal mood and affect.      Labs:  Estimated body mass index is 31.19 kg/m as calculated from the following:   Height as of 12/04/14: 6' (1.829 m).   Weight as of 12/04/14: 104.3 kg (230 lb).   Imaging  Review Plain radiographs demonstrate severe degenerative joint disease of the right hip(s). The bone quality appears to be good for age and reported activity level.  Assessment/Plan:  End stage arthritis, right hip(s)  The patient history, physical examination, clinical judgement of the provider and imaging studies are consistent with end stage degenerative joint disease of the right hip(s) and total hip arthroplasty is deemed medically necessary. The treatment options including medical management, injection therapy, arthroscopy and arthroplasty were discussed at length. The risks and benefits of total hip arthroplasty were presented and reviewed. The risks due to aseptic loosening, infection, stiffness, dislocation/subluxation,  thromboembolic complications and other imponderables were discussed.  The patient acknowledged the explanation, agreed to proceed with the plan and consent was signed. Patient is being admitted for inpatient treatment for surgery, pain control, PT, OT, prophylactic antibiotics, VTE prophylaxis, progressive ambulation and ADL's and discharge planning.The patient is planning to be discharged home.    [name]   PA-C  12/17/2016, 10:49 AM

## 2016-12-22 NOTE — Patient Instructions (Signed)
STANISLAV CZERWONKA  12/22/2016   Your procedure is scheduled on: 12/30/2016    Report to [facility] Main  Entrance   Follow signs to Short Stay on first floor at   0515 AM  Call this number if you have problems the morning of surgery  [phone]   Remember: ONLY 1 PERSON MAY GO WITH YOU TO SHORT STAY TO GET  READY MORNING OF YOUR SURGERY.  Do not eat food or drink liquids :After Midnight.     Take these medicines the morning of surgery with A SIP OF WATER: metoprolol ( Toprol), Flomax                                 You may not have any metal on your body including hair pins and              piercings  Do not wear jewelry,  lotions, powders or perfumes, deodorant          .              Men may shave face and neck.   Do not bring valuables to the hospital. [facility] IS NOT             RESPONSIBLE   FOR VALUABLES.  Contacts, dentures or bridgework may not be worn into surgery.  Leave suitcase in the car. After surgery it may be brought to your room.                       Please read over the following fact sheets you were given: _____________________________________________________________________             [facility] - Preparing for Surgery Before surgery, you can play an important role.  Because skin is not sterile, your skin needs to be as free of germs as possible.  You can reduce the number of germs on your skin by washing with CHG (chlorahexidine gluconate) soap before surgery.  CHG is an antiseptic cleaner which kills germs and bonds with the skin to continue killing germs even after washing. Please DO NOT use if you have an allergy to CHG or antibacterial soaps.  If your skin becomes reddened/irritated stop using the CHG and inform your nurse when you arrive at Short Stay. Do not shave (including legs and underarms) for at least 48 hours prior to the first CHG shower.  You may shave your face/neck. Please follow these instructions  carefully:  1.  Shower with CHG Soap the night before surgery and the  morning of Surgery.  2.  If you choose to wash your hair, wash your hair first as usual with your  normal  shampoo.  3.  After you shampoo, rinse your hair and body thoroughly to remove the  shampoo.                           4.  Use CHG as you would any other liquid soap.  You can apply chg directly  to the skin and wash                       Gently with a scrungie or clean washcloth.  5.  Apply the CHG Soap to your body ONLY  FROM THE NECK DOWN.   Do not use on face/ open                           Wound or open sores. Avoid contact with eyes, ears mouth and genitals (private parts).                       Wash face,  Genitals (private parts) with your normal soap.             6.  Wash thoroughly, paying special attention to the area where your surgery  will be performed.  7.  Thoroughly rinse your body with warm water from the neck down.  8.  DO NOT shower/wash with your normal soap after using and rinsing off  the CHG Soap.                9.  Pat yourself dry with a clean towel.            10.  Wear clean pajamas.            11.  Place clean sheets on your bed the night of your first shower and do not  sleep with pets. Day of Surgery : Do not apply any lotions/deodorants the morning of surgery.  Please wear clean clothes to the hospital/surgery center.  FAILURE TO FOLLOW THESE INSTRUCTIONS MAY RESULT IN THE CANCELLATION OF YOUR SURGERY PATIENT SIGNATURE_________________________________  NURSE SIGNATURE__________________________________  ________________________________________________________________________  WHAT IS A BLOOD TRANSFUSION? Blood Transfusion Information  A transfusion is the replacement of blood or some of its parts. Blood is made up of multiple cells which provide different functions.  Red blood cells carry oxygen and are used for blood loss replacement.  White blood cells fight against  infection.  Platelets control bleeding.  Plasma helps clot blood.  Other blood products are available for specialized needs, such as hemophilia or other clotting disorders. BEFORE THE TRANSFUSION  Who gives blood for transfusions?   Healthy volunteers who are fully evaluated to make sure their blood is safe. This is blood bank blood. Transfusion therapy is the safest it has ever been in the practice of medicine. Before blood is taken from a donor, a complete history is taken to make sure that person has no history of diseases nor engages in risky social behavior (examples are intravenous drug use or sexual activity with multiple partners). The donor's travel history is screened to minimize risk of transmitting infections, such as malaria. The donated blood is tested for signs of infectious diseases, such as HIV and hepatitis. The blood is then tested to be sure it is compatible with you in order to minimize the chance of a transfusion reaction. If you or a relative donates blood, this is often done in anticipation of surgery and is not appropriate for emergency situations. It takes many days to process the donated blood. RISKS AND COMPLICATIONS Although transfusion therapy is very safe and saves many lives, the main dangers of transfusion include:   Getting an infectious disease.  Developing a transfusion reaction. This is an allergic reaction to something in the blood you were given. Every precaution is taken to prevent this. The decision to have a blood transfusion has been considered carefully by your caregiver before blood is given. Blood is not given unless the benefits outweigh the risks. AFTER THE TRANSFUSION  Right after receiving a blood transfusion, you will usually feel much better  and more energetic. This is especially true if your red blood cells have gotten low (anemic). The transfusion raises the level of the red blood cells which carry oxygen, and this usually causes an energy  increase.  The nurse administering the transfusion will monitor you carefully for complications. HOME CARE INSTRUCTIONS  No special instructions are needed after a transfusion. You may find your energy is better. Speak with your caregiver about any limitations on activity for underlying diseases you may have. SEEK MEDICAL CARE IF:   Your condition is not improving after your transfusion.  You develop redness or irritation at the intravenous (IV) site. SEEK IMMEDIATE MEDICAL CARE IF:  Any of the following symptoms occur over the next 12 hours:  Shaking chills.  You have a temperature by mouth above 102 F (38.9 C), not controlled by medicine.  Chest, back, or muscle pain.  People around you feel you are not acting correctly or are confused.  Shortness of breath or difficulty breathing.  Dizziness and fainting.  You get a rash or develop hives.  You have a decrease in urine output.  Your urine turns a dark color or changes to pink, red, or brown. Any of the following symptoms occur over the next 10 days:  You have a temperature by mouth above 102 F (38.9 C), not controlled by medicine.  Shortness of breath.  Weakness after normal activity.  The white part of the eye turns yellow (jaundice).  You have a decrease in the amount of urine or are urinating less often.  Your urine turns a dark color or changes to pink, red, or brown. Document Released: 03/07/2000 Document Revised: 06/02/2011 Document Reviewed: 10/25/2007 Exit[employer] Patient Information 2014 [place].  _______________________________________________________________________  Incentive Spirometer  An incentive spirometer is a tool that can help keep your lungs clear and active. This tool measures how well you are filling your lungs with each breath. Taking long deep breaths may help reverse or decrease the chance of developing breathing (pulmonary) problems (especially infection) following:  A long  period of time when you are unable to move or be active. BEFORE THE PROCEDURE   If the spirometer includes an indicator to show your best effort, your nurse or respiratory therapist will set it to a desired goal.  If possible, sit up straight or lean slightly forward. Try not to slouch.  Hold the incentive spirometer in an upright position. INSTRUCTIONS FOR USE  1. Sit on the edge of your bed if possible, or sit up as far as you can in bed or on a chair. 2. Hold the incentive spirometer in an upright position. 3. Breathe out normally. 4. Place the mouthpiece in your mouth and seal your lips tightly around it. 5. Breathe in slowly and as deeply as possible, raising the piston or the ball toward the top of the column. 6. Hold your breath for 3-5 seconds or for as long as possible. Allow the piston or ball to fall to the bottom of the column. 7. Remove the mouthpiece from your mouth and breathe out normally. 8. Rest for a few seconds and repeat Steps 1 through 7 at least 10 times every 1-2 hours when you are awake. Take your time and take a few normal breaths between deep breaths. 9. The spirometer may include an indicator to show your best effort. Use the indicator as a goal to work toward during each repetition. 10. After each set of 10 deep breaths, practice coughing to be sure your  lungs are clear. If you have an incision (the cut made at the time of surgery), support your incision when coughing by placing a pillow or rolled up towels firmly against it. Once you are able to get out of bed, walk around indoors and cough well. You may stop using the incentive spirometer when instructed by your caregiver.  RISKS AND COMPLICATIONS  Take your time so you do not get dizzy or light-headed.  If you are in pain, you may need to take or ask for pain medication before doing incentive spirometry. It is harder to take a deep breath if you are having pain. AFTER USE  Rest and breathe slowly and  easily.  It can be helpful to keep track of a log of your progress. Your caregiver can provide you with a simple table to help with this. If you are using the spirometer at home, follow these instructions: [place] IF:   You are having difficultly using the spirometer.  You have trouble using the spirometer as often as instructed.  Your pain medication is not giving enough relief while using the spirometer.  You develop fever of 100.5 F (38.1 C) or higher. SEEK IMMEDIATE MEDICAL CARE IF:   You cough up bloody sputum that had not been present before.  You develop fever of 102 F (38.9 C) or greater.  You develop worsening pain at or near the incision site. MAKE SURE YOU:   Understand these instructions.  Will watch your condition.  Will get help right away if you are not doing well or get worse. Document Released: 07/21/2006 Document Revised: 06/02/2011 Document Reviewed: 09/21/2006 [facility] Patient Information 2014 [place], [place].   ________________________________________________________________________

## 2016-12-23 ENCOUNTER — Encounter (HOSPITAL_COMMUNITY)
Admission: RE | Admit: 2016-12-23 | Discharge: 2016-12-23 | Disposition: A | Discharge status: Home / Self Care | Payer: 59 | Source: Ambulatory Visit | Attending: Orthopedic Surgery | Admitting: Orthopedic Surgery

## 2016-12-23 ENCOUNTER — Encounter (HOSPITAL_COMMUNITY): Payer: Self-pay

## 2016-12-23 ENCOUNTER — Encounter (INDEPENDENT_AMBULATORY_CARE_PROVIDER_SITE_OTHER): Payer: Self-pay

## 2016-12-23 DIAGNOSIS — M1611 Unilateral primary osteoarthritis, right hip: Secondary | ICD-10-CM | POA: Diagnosis not present

## 2016-12-23 DIAGNOSIS — Z01818 Encounter for other preprocedural examination: Secondary | ICD-10-CM | POA: Insufficient documentation

## 2016-12-23 HISTORY — DX: Unspecified osteoarthritis, unspecified site: M19.90

## 2016-12-23 HISTORY — DX: Gastro-esophageal reflux disease without esophagitis: K21.9

## 2016-12-23 HISTORY — DX: Personal history of urinary calculi: Z87.442

## 2016-12-23 LAB — CBC
HCT: 38.7 % — ABNORMAL LOW (ref 39.0–52.0)
Hemoglobin: 13.7 g/dL (ref 13.0–17.0)
MCH: 31.6 pg (ref 26.0–34.0)
MCHC: 35.4 g/dL (ref 30.0–36.0)
MCV: 89.4 fL (ref 78.0–100.0)
Platelets: 254 10*3/uL (ref 150–400)
RBC: 4.33 MIL/uL (ref 4.22–5.81)
RDW: 13.4 % (ref 11.5–15.5)
WBC: 9.5 10*3/uL (ref 4.0–10.5)

## 2016-12-23 LAB — BASIC METABOLIC PANEL
Anion gap: 8 (ref 5–15)
BUN: 13 mg/dL (ref 6–20)
CO2: 23 mmol/L (ref 22–32)
Calcium: 9 mg/dL (ref 8.9–10.3)
Chloride: 105 mmol/L (ref 101–111)
Creatinine, Ser: 0.89 mg/dL (ref 0.61–1.24)
GFR calc Af Amer: >60 mL/min (ref >60)
GFR calc non Af Amer: >60 mL/min (ref >60)
GLUCOSE: 89 mg/dL (ref 65–99)
Potassium: 3.8 mmol/L (ref 3.5–5.1)
Sodium: 136 mmol/L (ref 135–145)

## 2016-12-23 LAB — SURGICAL PCR SCREEN
MRSA, PCR: NEGATIVE
STAPHYLOCOCCUS AUREUS: NEGATIVE

## 2016-12-24 LAB — ABO/RH: ABO/RH(D): A POS

## 2016-12-29 NOTE — Progress Notes (Signed)
Received and placed on chart EKG done 11/2016.

## 2016-12-29 NOTE — Progress Notes (Signed)
Clearance from Dr Beverely Pace on chart

## 2016-12-29 NOTE — Progress Notes (Signed)
Recalled office of Dr Beverely Pace at [phone] and EKG done 11/22/16 in office has not yet been scanned in. Office personnel will attempt to obtain out of chart and fax 12 lead EKG tracing to [phone].

## 2016-12-29 NOTE — Anesthesia Preprocedure Evaluation (Addendum)
Anesthesia Evaluation  Patient identified by MRN, date of birth, ID band Patient awake    Reviewed: Allergy & Precautions, H&P , NPO status , Patient's Chart, lab work & pertinent test results  Airway Mallampati: II  TM Distance: >3 FB Neck ROM: Full    Dental no notable dental hx. (+) Teeth Intact, Dental Advisory Given   Pulmonary neg pulmonary ROS, former smoker,    Pulmonary exam normal breath sounds clear to auscultation       Cardiovascular Exercise Tolerance: Good hypertension, Pt. on medications and Pt. on home beta blockers + CAD and + Cardiac Stents   Rhythm:Regular Rate:Normal     Neuro/Psych negative neurological ROS  negative psych ROS   GI/Hepatic Neg liver ROS, GERD  Controlled,  Endo/Other  negative endocrine ROS  Renal/GU negative Renal ROS  negative genitourinary   Musculoskeletal  (+) Arthritis , Osteoarthritis,    Abdominal   Peds  Hematology negative hematology ROS (+)   Anesthesia Other Findings   Reproductive/Obstetrics negative OB ROS                            Anesthesia Physical Anesthesia Plan  ASA: III  Anesthesia Plan: Spinal   Post-op Pain Management:    Induction: Intravenous  PONV Risk Score and Plan: 2 and Ondansetron, Dexamethasone and Propofol infusion  Airway Management Planned: Simple Face Mask  Additional Equipment:   Intra-op Plan:   Post-operative Plan:   Informed Consent: I have reviewed the patients History and Physical, chart, labs and discussed the procedure including the risks, benefits and alternatives for the proposed anesthesia with the patient or authorized representative who has indicated his/her understanding and acceptance.   Dental advisory given  Plan Discussed with: CRNA  Anesthesia Plan Comments:         Anesthesia Quick Evaluation

## 2016-12-29 NOTE — Progress Notes (Signed)
On 12/23/2016 sent a faxed request for 12 lead ekg tracing from Dr Beverely Pace. .  On 12/25/2016 sent a faxed request for 12 lead ekg tracing done 12/02/16 at office of Dr Beverely Pace.  [facility] does not release those from MD office.  Called office of Dr Heide Scales and requested 12 lead ekg tracing.

## 2016-12-30 ENCOUNTER — Encounter (HOSPITAL_COMMUNITY): Payer: Self-pay

## 2016-12-30 ENCOUNTER — Inpatient Hospital Stay (HOSPITAL_COMMUNITY): Payer: 59

## 2016-12-30 ENCOUNTER — Inpatient Hospital Stay (HOSPITAL_COMMUNITY): Payer: 59 | Admitting: Certified Registered Nurse Anesthetist

## 2016-12-30 ENCOUNTER — Inpatient Hospital Stay (HOSPITAL_COMMUNITY)
Admission: RE | Admit: 2016-12-30 | Discharge: 2016-12-31 | DRG: 470 | Disposition: A | Discharge status: Home / Self Care | Payer: 59 | Attending: Orthopedic Surgery | Admitting: Orthopedic Surgery

## 2016-12-30 ENCOUNTER — Encounter (HOSPITAL_COMMUNITY)
Admission: RE | Disposition: A | Discharge status: Home / Self Care | Payer: Self-pay | Source: Home / Self Care | Attending: Orthopedic Surgery

## 2016-12-30 DIAGNOSIS — I252 Old myocardial infarction: Secondary | ICD-10-CM | POA: Diagnosis not present

## 2016-12-30 DIAGNOSIS — M25551 Pain in right hip: Secondary | ICD-10-CM

## 2016-12-30 DIAGNOSIS — K219 Gastro-esophageal reflux disease without esophagitis: Secondary | ICD-10-CM | POA: Diagnosis present

## 2016-12-30 DIAGNOSIS — F17210 Nicotine dependence, cigarettes, uncomplicated: Secondary | ICD-10-CM | POA: Diagnosis present

## 2016-12-30 DIAGNOSIS — Z955 Presence of coronary angioplasty implant and graft: Secondary | ICD-10-CM | POA: Diagnosis not present

## 2016-12-30 DIAGNOSIS — Z87442 Personal history of urinary calculi: Secondary | ICD-10-CM

## 2016-12-30 DIAGNOSIS — M1611 Unilateral primary osteoarthritis, right hip: Principal | ICD-10-CM | POA: Diagnosis present

## 2016-12-30 DIAGNOSIS — I10 Essential (primary) hypertension: Secondary | ICD-10-CM | POA: Diagnosis present

## 2016-12-30 DIAGNOSIS — E669 Obesity, unspecified: Secondary | ICD-10-CM | POA: Diagnosis present

## 2016-12-30 DIAGNOSIS — Z6831 Body mass index (BMI) 31.0-31.9, adult: Secondary | ICD-10-CM | POA: Diagnosis not present

## 2016-12-30 DIAGNOSIS — Z96649 Presence of unspecified artificial hip joint: Secondary | ICD-10-CM

## 2016-12-30 DIAGNOSIS — I2510 Atherosclerotic heart disease of native coronary artery without angina pectoris: Secondary | ICD-10-CM | POA: Diagnosis present

## 2016-12-30 HISTORY — PX: TOTAL HIP ARTHROPLASTY: SHX124

## 2016-12-30 LAB — TYPE AND SCREEN
ABO/RH(D): A POS
ANTIBODY SCREEN: NEGATIVE

## 2016-12-30 SURGERY — ARTHROPLASTY, HIP, TOTAL, ANTERIOR APPROACH
Anesthesia: Spinal | Site: Hip | Laterality: Right

## 2016-12-30 MED ORDER — HYDROMORPHONE HCL-NACL 0.5-0.9 MG/ML-% IV SOSY
0.2500 mg | PREFILLED_SYRINGE | INTRAVENOUS | Status: DC | PRN
Start: 2016-12-30 — End: 2016-12-30

## 2016-12-30 MED ORDER — NITROGLYCERIN 0.4 MG SL SUBL: 0.4000 mg | SUBLINGUAL_TABLET | SUBLINGUAL | Status: DC | PRN

## 2016-12-30 MED ORDER — HYDROCODONE-ACETAMINOPHEN 7.5-325 MG PO TABS: 1.0000 | ORAL_TABLET | ORAL | 0 refills | Status: DC | PRN

## 2016-12-30 MED ORDER — PROPOFOL 10 MG/ML IV BOLUS
INTRAVENOUS | Status: AC
  Filled 2016-12-30: qty 20

## 2016-12-30 MED ORDER — ONDANSETRON HCL 4 MG/2ML IJ SOLN
INTRAMUSCULAR | Status: DC | PRN
  Administered 2016-12-30: 4 mg via INTRAVENOUS

## 2016-12-30 MED ORDER — FERROUS SULFATE 325 (65 FE) MG PO TABS: 325.0000 mg | ORAL_TABLET | Freq: Three times a day (TID) | ORAL | 3 refills | Status: DC

## 2016-12-30 MED ORDER — PHENYLEPHRINE 40 MCG/ML (10ML) SYRINGE FOR IV PUSH (FOR BLOOD PRESSURE SUPPORT)
PREFILLED_SYRINGE | INTRAVENOUS | Status: AC
  Filled 2016-12-30: qty 10

## 2016-12-30 MED ORDER — HYDROCODONE-ACETAMINOPHEN 7.5-325 MG PO TABS
1.0000 | ORAL_TABLET | ORAL | Status: DC
  Administered 2016-12-30 (×2): 2 via ORAL
  Administered 2016-12-30 (×2): 1 via ORAL
  Administered 2016-12-31 (×2): 2 via ORAL
  Filled 2016-12-30 (×5): qty 2

## 2016-12-30 MED ORDER — ONDANSETRON HCL 4 MG/2ML IJ SOLN: 4.0000 mg | Freq: Four times a day (QID) | INTRAMUSCULAR | Status: DC | PRN

## 2016-12-30 MED ORDER — ONDANSETRON HCL 4 MG PO TABS: 4.0000 mg | ORAL_TABLET | Freq: Four times a day (QID) | ORAL | Status: DC | PRN

## 2016-12-30 MED ORDER — TRANEXAMIC ACID 1000 MG/10ML IV SOLN
1000.0000 mg | Freq: Once | INTRAVENOUS | Status: AC
  Administered 2016-12-30: 1000 mg via INTRAVENOUS
  Filled 2016-12-30: qty 1100

## 2016-12-30 MED ORDER — CEFAZOLIN SODIUM-DEXTROSE 2-4 GM/100ML-% IV SOLN
2.0000 g | INTRAVENOUS | Status: AC
  Administered 2016-12-30: 2 g via INTRAVENOUS

## 2016-12-30 MED ORDER — PROPOFOL 10 MG/ML IV BOLUS
INTRAVENOUS | Status: DC | PRN
  Administered 2016-12-30: 20 mg via INTRAVENOUS

## 2016-12-30 MED ORDER — SODIUM CHLORIDE 0.9 % IV SOLN
INTRAVENOUS | Status: DC
Start: 2016-12-30 — End: 2016-12-31

## 2016-12-30 MED ORDER — LACTATED RINGERS IV SOLN
INTRAVENOUS | Status: DC
  Administered 2016-12-30: 1000 mL via INTRAVENOUS
  Administered 2016-12-30: 07:00:00 via INTRAVENOUS

## 2016-12-30 MED ORDER — DIPHENHYDRAMINE HCL 25 MG PO CAPS: 25.0000 mg | ORAL_CAPSULE | Freq: Four times a day (QID) | ORAL | Status: DC | PRN

## 2016-12-30 MED ORDER — MIDAZOLAM HCL 5 MG/5ML IJ SOLN
INTRAMUSCULAR | Status: DC | PRN
  Administered 2016-12-30: 2 mg via INTRAVENOUS

## 2016-12-30 MED ORDER — HYDROMORPHONE HCL-NACL 0.5-0.9 MG/ML-% IV SOSY
0.5000 mg | PREFILLED_SYRINGE | INTRAVENOUS | Status: DC | PRN
  Administered 2016-12-30: 14:00:00 0.5 mg via INTRAVENOUS
  Filled 2016-12-30: qty 2

## 2016-12-30 MED ORDER — DEXAMETHASONE SODIUM PHOSPHATE 10 MG/ML IJ SOLN
10.0000 mg | Freq: Once | INTRAMUSCULAR | Status: AC
  Administered 2016-12-31: 10 mg via INTRAVENOUS
  Filled 2016-12-30: qty 1

## 2016-12-30 MED ORDER — METHOCARBAMOL 500 MG PO TABS
500.0000 mg | ORAL_TABLET | Freq: Four times a day (QID) | ORAL | Status: DC | PRN
  Filled 2016-12-30: qty 1

## 2016-12-30 MED ORDER — CEFAZOLIN SODIUM-DEXTROSE 2-4 GM/100ML-% IV SOLN
INTRAVENOUS | Status: AC
  Filled 2016-12-30: qty 100

## 2016-12-30 MED ORDER — METOCLOPRAMIDE HCL 5 MG/ML IJ SOLN: 5.0000 mg | Freq: Three times a day (TID) | INTRAMUSCULAR | Status: DC | PRN

## 2016-12-30 MED ORDER — CHLORHEXIDINE GLUCONATE 4 % EX LIQD
60.0000 mL | Freq: Once | CUTANEOUS | Status: DC
Start: 2016-12-30 — End: 2016-12-30

## 2016-12-30 MED ORDER — BUPIVACAINE HCL (PF) 0.75 % IJ SOLN
INTRAMUSCULAR | Status: DC | PRN
  Administered 2016-12-30: 2 mL via INTRATHECAL

## 2016-12-30 MED ORDER — ASPIRIN 81 MG PO CHEW: 81.0000 mg | CHEWABLE_TABLET | Freq: Two times a day (BID) | ORAL | 0 refills | Status: AC

## 2016-12-30 MED ORDER — METOCLOPRAMIDE HCL 5 MG PO TABS: 5.0000 mg | ORAL_TABLET | Freq: Three times a day (TID) | ORAL | Status: DC | PRN

## 2016-12-30 MED ORDER — PROPOFOL 10 MG/ML IV BOLUS
INTRAVENOUS | Status: AC
  Filled 2016-12-30: qty 60

## 2016-12-30 MED ORDER — FERROUS SULFATE 325 (65 FE) MG PO TABS
325.0000 mg | ORAL_TABLET | Freq: Three times a day (TID) | ORAL | Status: DC
  Administered 2016-12-31 (×2): 325 mg via ORAL
  Filled 2016-12-30 (×2): qty 1

## 2016-12-30 MED ORDER — STERILE WATER FOR IRRIGATION IR SOLN
Status: DC | PRN
  Administered 2016-12-30: 2000 mL

## 2016-12-30 MED ORDER — 0.9 % SODIUM CHLORIDE (POUR BTL) OPTIME
TOPICAL | Status: DC | PRN
  Administered 2016-12-30: 1000 mL

## 2016-12-30 MED ORDER — PROPOFOL 500 MG/50ML IV EMUL
INTRAVENOUS | Status: DC | PRN
  Administered 2016-12-30: 100 ug/kg/min via INTRAVENOUS

## 2016-12-30 MED ORDER — MENTHOL 3 MG MT LOZG
1.0000 | LOZENGE | OROMUCOSAL | Status: DC | PRN
Start: 2016-12-30 — End: 2016-12-31

## 2016-12-30 MED ORDER — METHOCARBAMOL 500 MG PO TABS: 500.0000 mg | ORAL_TABLET | Freq: Four times a day (QID) | ORAL | 0 refills | Status: DC | PRN

## 2016-12-30 MED ORDER — ATORVASTATIN CALCIUM 40 MG PO TABS
40.0000 mg | ORAL_TABLET | Freq: Every day | ORAL | Status: DC
  Administered 2016-12-30: 40 mg via ORAL
  Filled 2016-12-30: qty 1

## 2016-12-30 MED ORDER — ONDANSETRON HCL 4 MG/2ML IJ SOLN
INTRAMUSCULAR | Status: AC
  Filled 2016-12-30: qty 2

## 2016-12-30 MED ORDER — MIDAZOLAM HCL 2 MG/2ML IJ SOLN
INTRAMUSCULAR | Status: AC
  Filled 2016-12-30: qty 2

## 2016-12-30 MED ORDER — ASPIRIN 81 MG PO CHEW
81.0000 mg | CHEWABLE_TABLET | Freq: Two times a day (BID) | ORAL | Status: DC
  Administered 2016-12-30 – 2016-12-31 (×2): 81 mg via ORAL
  Filled 2016-12-30 (×2): qty 1

## 2016-12-30 MED ORDER — ALUM & MAG HYDROXIDE-SIMETH 200-200-20 MG/5ML PO SUSP: 15.0000 mL | ORAL | Status: DC | PRN

## 2016-12-30 MED ORDER — MAGNESIUM CITRATE PO SOLN: 1.0000 | Freq: Once | ORAL | Status: DC | PRN

## 2016-12-30 MED ORDER — CEFAZOLIN SODIUM-DEXTROSE 2-4 GM/100ML-% IV SOLN
2.0000 g | Freq: Four times a day (QID) | INTRAVENOUS | Status: AC
  Administered 2016-12-30 (×2): 2 g via INTRAVENOUS
  Filled 2016-12-30 (×2): qty 100

## 2016-12-30 MED ORDER — FENTANYL CITRATE (PF) 100 MCG/2ML IJ SOLN
INTRAMUSCULAR | Status: AC
  Filled 2016-12-30: qty 2

## 2016-12-30 MED ORDER — BISACODYL 10 MG RE SUPP: 10.0000 mg | Freq: Every day | RECTAL | Status: DC | PRN

## 2016-12-30 MED ORDER — METOPROLOL SUCCINATE ER 25 MG PO TB24
25.0000 mg | ORAL_TABLET | Freq: Every morning | ORAL | Status: DC
  Administered 2016-12-31: 09:00:00 25 mg via ORAL
  Filled 2016-12-30: qty 1

## 2016-12-30 MED ORDER — DOCUSATE SODIUM 100 MG PO CAPS: 100.0000 mg | ORAL_CAPSULE | Freq: Two times a day (BID) | ORAL | 0 refills | Status: DC

## 2016-12-30 MED ORDER — PHENOL 1.4 % MT LIQD: 1.0000 | OROMUCOSAL | Status: DC | PRN

## 2016-12-30 MED ORDER — PHENYLEPHRINE 40 MCG/ML (10ML) SYRINGE FOR IV PUSH (FOR BLOOD PRESSURE SUPPORT)
PREFILLED_SYRINGE | INTRAVENOUS | Status: DC | PRN
  Administered 2016-12-30: 80 ug via INTRAVENOUS
  Administered 2016-12-30: 40 ug via INTRAVENOUS
  Administered 2016-12-30 (×2): 80 ug via INTRAVENOUS

## 2016-12-30 MED ORDER — LIDOCAINE 2% (20 MG/ML) 5 ML SYRINGE
INTRAMUSCULAR | Status: AC
  Filled 2016-12-30: qty 5

## 2016-12-30 MED ORDER — METHOCARBAMOL 1000 MG/10ML IJ SOLN
500.0000 mg | Freq: Four times a day (QID) | INTRAVENOUS | Status: DC | PRN
  Administered 2016-12-30: 500 mg via INTRAVENOUS
  Filled 2016-12-30: qty 550

## 2016-12-30 MED ORDER — SODIUM CHLORIDE 0.9 % IV SOLN
1000.0000 mg | INTRAVENOUS | Status: AC
  Administered 2016-12-30: 1000 mg via INTRAVENOUS
  Filled 2016-12-30: qty 10

## 2016-12-30 MED ORDER — DEXAMETHASONE SODIUM PHOSPHATE 10 MG/ML IJ SOLN
INTRAMUSCULAR | Status: AC
  Filled 2016-12-30: qty 1

## 2016-12-30 MED ORDER — CELECOXIB 200 MG PO CAPS
200.0000 mg | ORAL_CAPSULE | Freq: Two times a day (BID) | ORAL | Status: DC
  Administered 2016-12-30 – 2016-12-31 (×3): 200 mg via ORAL
  Filled 2016-12-30 (×3): qty 1

## 2016-12-30 MED ORDER — POLYETHYLENE GLYCOL 3350 17 G PO PACK
17.0000 g | PACK | Freq: Two times a day (BID) | ORAL | Status: DC
  Administered 2016-12-31: 17 g via ORAL
  Filled 2016-12-30: qty 1

## 2016-12-30 MED ORDER — DOCUSATE SODIUM 100 MG PO CAPS
100.0000 mg | ORAL_CAPSULE | Freq: Two times a day (BID) | ORAL | Status: DC
  Administered 2016-12-30 – 2016-12-31 (×2): 100 mg via ORAL
  Filled 2016-12-30 (×2): qty 1

## 2016-12-30 MED ORDER — FENTANYL CITRATE (PF) 100 MCG/2ML IJ SOLN
INTRAMUSCULAR | Status: DC | PRN
  Administered 2016-12-30 (×2): 50 ug via INTRAVENOUS

## 2016-12-30 MED ORDER — DEXAMETHASONE SODIUM PHOSPHATE 10 MG/ML IJ SOLN
10.0000 mg | Freq: Once | INTRAMUSCULAR | Status: AC
  Administered 2016-12-30: 10 mg via INTRAVENOUS

## 2016-12-30 MED ORDER — POLYETHYLENE GLYCOL 3350 17 G PO PACK: 17.0000 g | PACK | Freq: Two times a day (BID) | ORAL | 0 refills | Status: DC

## 2016-12-30 SURGICAL SUPPLY — 37 items
ADH SKN CLS APL DERMABOND .7 (GAUZE/BANDAGES/DRESSINGS) ×1
BAG DECANTER FOR FLEXI CONT (MISCELLANEOUS) IMPLANT
BAG SPEC THK2 15X12 ZIP CLS (MISCELLANEOUS)
BAG ZIPLOCK 12X15 (MISCELLANEOUS) IMPLANT
BLADE SAG 18X100X1.27 (BLADE) ×3 IMPLANT
CAPT HIP TOTAL 2 ×2 IMPLANT
CLOTH BEACON ORANGE TIMEOUT ST (SAFETY) ×3 IMPLANT
COVER PERINEAL POST (MISCELLANEOUS) ×3 IMPLANT
COVER SURGICAL LIGHT HANDLE (MISCELLANEOUS) ×3 IMPLANT
DERMABOND ADVANCED (GAUZE/BANDAGES/DRESSINGS) ×2
DERMABOND ADVANCED .7 DNX12 (GAUZE/BANDAGES/DRESSINGS) ×1 IMPLANT
DRAPE STERI IOBAN 125X83 (DRAPES) ×3 IMPLANT
DRAPE U-SHAPE 47X51 STRL (DRAPES) ×6 IMPLANT
DRESSING AQUACEL AG SP 3.5X10 (GAUZE/BANDAGES/DRESSINGS) ×1 IMPLANT
DRSG AQUACEL AG SP 3.5X10 (GAUZE/BANDAGES/DRESSINGS) ×3
DURAPREP 26ML APPLICATOR (WOUND CARE) ×3 IMPLANT
ELECT REM PT RETURN 15FT ADLT (MISCELLANEOUS) ×3 IMPLANT
GLOVE BIOGEL M STRL SZ7.5 (GLOVE) IMPLANT
GLOVE BIOGEL PI IND STRL 7.5 (GLOVE) ×1 IMPLANT
GLOVE BIOGEL PI IND STRL 8.5 (GLOVE) ×1 IMPLANT
GLOVE BIOGEL PI INDICATOR 7.5 (GLOVE) ×2
GLOVE BIOGEL PI INDICATOR 8.5 (GLOVE) ×2
GLOVE ECLIPSE 8.0 STRL XLNG CF (GLOVE) ×6 IMPLANT
GLOVE ORTHO TXT STRL SZ7.5 (GLOVE) ×3 IMPLANT
GOWN STRL REUS W/TWL LRG LVL3 (GOWN DISPOSABLE) ×3 IMPLANT
GOWN STRL REUS W/TWL XL LVL3 (GOWN DISPOSABLE) ×3 IMPLANT
HOLDER FOLEY CATH W/STRAP (MISCELLANEOUS) ×3 IMPLANT
PACK ANTERIOR HIP CUSTOM (KITS) ×3 IMPLANT
SUT MNCRL AB 4-0 PS2 18 (SUTURE) ×3 IMPLANT
SUT STRATAFIX 0 PDS 27 VIOLET (SUTURE) ×3
SUT VIC AB 1 CT1 36 (SUTURE) ×9 IMPLANT
SUT VIC AB 2-0 CT1 27 (SUTURE) ×6
SUT VIC AB 2-0 CT1 TAPERPNT 27 (SUTURE) ×2 IMPLANT
SUTURE STRATFX 0 PDS 27 VIOLET (SUTURE) ×1 IMPLANT
TRAY FOLEY W/METER SILVER 16FR (SET/KITS/TRAYS/PACK) ×2 IMPLANT
WATER STERILE IRR 1500ML POUR (IV SOLUTION) ×5 IMPLANT
YANKAUER SUCT BULB TIP 10FT TU (MISCELLANEOUS) ×2 IMPLANT

## 2016-12-30 NOTE — Interval H&P Note (Signed)
History and Physical Interval Note:  12/30/2016 6:44 AM  Darryl Ramsey  has presented today for surgery, with the diagnosis of Right hip osteoarthritis  The various methods of treatment have been discussed with the patient and family. After consideration of risks, benefits and other options for treatment, the patient has consented to  Procedure(s) with comments: RIGHT TOTAL HIP ARTHROPLASTY ANTERIOR APPROACH (Right) - 70 mins as a surgical intervention .  The patient's history has been reviewed, patient examined, no change in status, stable for surgery.  I have reviewed the patient's chart and labs.  Questions were answered to the patient's satisfaction.     Shelda Pal

## 2016-12-30 NOTE — Anesthesia Procedure Notes (Addendum)
Spinal  Patient location during procedure: OR Start time: 12/30/2016 7:24 AM End time: 12/30/2016 7:27 AM Staffing Anesthesiologist: Gaynelle Adu Performed: anesthesiologist  Preanesthetic Checklist Completed: patient identified, site marked, surgical consent, pre-op evaluation, timeout performed, IV checked, risks and benefits discussed and monitors and equipment checked Spinal Block Patient position: sitting Prep: ChloraPrep Patient monitoring: heart rate, cardiac monitor, continuous pulse ox and blood pressure Approach: midline Location: L3-4 Injection technique: single-shot Needle Needle type: Pencan  Needle gauge: 24 G Needle length: 9 cm Needle insertion depth: 7 cm Assessment Sensory level: T8 Additional Notes Attempt times one by CRNA, successful first attempt by MD

## 2016-12-30 NOTE — Discharge Instructions (Signed)

## 2016-12-30 NOTE — Evaluation (Signed)
Physical Therapy Evaluation Patient Details Name: Darryl Ramsey MRN: [id number] DOB: 1955/05/17 Today's Date: 12/30/2016   History of Present Illness  61 yo male admitted s/p R THA-direct anterior 12/30/16.   Clinical Impression  On eval POD 0, pt required Min assist for mobility. He walked ~65 feet with a RW. Pain rated 6/10 with activity. Anticipate pt will progress well. Will follow during hospital stay.     Follow Up Recommendations No PT follow up    Equipment Recommendations  Rolling walker with 5" wheels    Recommendations for Other Services       Precautions / Restrictions Precautions Precautions: Fall Restrictions Weight Bearing Restrictions: No RLE Weight Bearing: Weight bearing as tolerated      Mobility  Bed Mobility Overal bed mobility: Needs Assistance Bed Mobility: Supine to Sit     Supine to sit: Min assist;HOB elevated     General bed mobility comments: Assist for R LE  Transfers Overall transfer level: Needs assistance Equipment used: Rolling walker (2 wheeled) Transfers: Sit to/from Stand Sit to Stand: Min assist         General transfer comment: Assist to rise, stabilize, control descent. VCs safety, technique, hand/LE placement  Ambulation/Gait Ambulation/Gait assistance: Min guard Ambulation Distance (Feet): 65 Feet Assistive device: Rolling walker (2 wheeled) Gait Pattern/deviations: Step-to pattern;Step-through pattern;Decreased stride length     General Gait Details: close guard for safety. VCs safety, sequence.   Stairs            Wheelchair Mobility    Modified Rankin (Stroke Patients Only)       Balance Overall balance assessment: Needs assistance         Standing balance support: Bilateral upper extremity supported Standing balance-Leahy Scale: Poor                               Pertinent Vitals/Pain Pain Assessment: 0-10 Pain Score: 6  Pain Location: R LE Pain Intervention(s): Limited  activity within patient's tolerance    Home Living Family/patient expects to be discharged to:: Private residence Living Arrangements: Spouse/significant other Available Help at Discharge: Family Type of Home: House Home Access: Stairs to enter   Secretary/administrator of Steps: 1 Home Layout: One level Home Equipment: Environmental consultant - standard      Prior Function Level of Independence: Independent               Hand Dominance        Extremity/Trunk Assessment   Upper Extremity Assessment Upper Extremity Assessment: Overall WFL for tasks assessed    Lower Extremity Assessment Lower Extremity Assessment: Generalized weakness (s/p R THA)    Cervical / Trunk Assessment Cervical / Trunk Assessment: Normal  Communication   Communication: No difficulties  Cognition Arousal/Alertness: Awake/alert Behavior During Therapy: WFL for tasks assessed/performed Overall Cognitive Status: Within Functional Limits for tasks assessed                                        General Comments      Exercises     Assessment/Plan    PT Assessment Patient needs continued PT services  PT Problem List Decreased strength;Decreased mobility;Decreased range of motion;Decreased activity tolerance;Decreased balance;Decreased knowledge of use of DME;Pain       PT Treatment Interventions DME instruction;Gait training;Therapeutic exercise;Patient/family education;Balance training;Stair training;Functional mobility training;Therapeutic activities  PT Goals (Current goals can be found in the Care Plan section)  Acute Rehab PT Goals Patient Stated Goal: home. regain PLOF PT Goal Formulation: With patient/family Time For Goal Achievement: 01/13/17 Potential to Achieve Goals: Good    Frequency 7X/week   Barriers to discharge        Co-evaluation               AM-PAC PT "6 Clicks" Daily Activity  Outcome Measure Difficulty turning over in bed (including adjusting  bedclothes, sheets and blankets)?: Unable Difficulty moving from lying on back to sitting on the side of the bed? : Unable Difficulty sitting down on and standing up from a chair with arms (e.g., wheelchair, bedside commode, etc,.)?: Unable Help needed moving to and from a bed to chair (including a wheelchair)?: A Little Help needed walking in hospital room?: A Little Help needed climbing 3-5 steps with a railing? : A Little 6 Click Score: 12    End of Session Equipment Utilized During Treatment: Gait belt Activity Tolerance: Patient tolerated treatment well Patient left: in chair;with family/visitor present;with call bell/phone within reach   PT Visit Diagnosis: Muscle weakness (generalized) (M62.81);Difficulty in walking, not elsewhere classified (R26.2)    Time: 8338-2505 PT Time Calculation (min) (ACUTE ONLY): 15 min   Charges:   PT Evaluation $PT Eval Low Complexity: 1 Low     PT G Codes:          Rebeca Alert, MPT Pager: [phone]

## 2016-12-30 NOTE — Op Note (Signed)
NAME:  SONNY ANTHES NO.: [id number]      MEDICAL RECORD NO.: [id number]      FACILITY:  [facility]      PHYSICIAN:  Durene Romans D  DATE OF BIRTH:  05-20-1955     DATE OF PROCEDURE:  12/30/2016                                 OPERATIVE REPORT         PREOPERATIVE DIAGNOSIS: Right  hip osteoarthritis.      POSTOPERATIVE DIAGNOSIS:  Right hip osteoarthritis.      PROCEDURE:  Right total hip replacement through an anterior approach   utilizing DePuy THR system, component size 56mm pinnacle cup, a size 36+4 neutral   Altrex liner, a size 6Hi Tri Lock stem with a 36+1.5 delta ceramic   ball.      SURGEON:  Madlyn Frankel. Charlann Boxer, M.D.      ASSISTANT:  Lanney Gins, PA-C     ANESTHESIA:  Spinal.      SPECIMENS:  None.      COMPLICATIONS:  None.      BLOOD LOSS:  200 cc     DRAINS:  None.      INDICATION OF THE PROCEDURE:  DHRUVA ORNDOFF is a 61 y.o. male who had   presented to office for evaluation of right hip pain.  Radiographs revealed   progressive degenerative changes with bone-on-bone   articulation to the  hip joint.  The patient had painful limited range of   motion significantly affecting their overall quality of life.  The patient was failing to    respond to conservative measures, and at this point was ready   to proceed with more definitive measures.  The patient has noted progressive   degenerative changes in his hip, progressive problems and dysfunction   with regarding the hip prior to surgery.  Consent was obtained for   benefit of pain relief.  Specific risk of infection, DVT, component   failure, dislocation, need for revision surgery, as well discussion of   the anterior versus posterior approach were reviewed.  Consent was   obtained for benefit of anterior pain relief through an anterior   approach.      PROCEDURE IN DETAIL:  The patient was brought to operative theater.   Once adequate anesthesia,  preoperative antibiotics, 2 gm of Ancef, 1 gm of Tranexamic Acid, and 10 mg of Decadron administered.   The patient was positioned supine on the OSI Hanna table.  Once adequate   padding of boney process was carried out, we had predraped out the hip, and  used fluoroscopy to confirm orientation of the pelvis and position.      The right hip was then prepped and draped from proximal iliac crest to   mid thigh with shower curtain technique.      Time-out was performed identifying the patient, planned procedure, and   extremity.     An incision was then made 2 cm distal and lateral to the   anterior superior iliac spine extending over the orientation of the   tensor fascia lata muscle and sharp dissection was carried down to the   fascia of the muscle and protractor placed in the soft tissues.      The fascia was  then incised.  The muscle belly was identified and swept   laterally and retractor placed along the superior neck.  Following   cauterization of the circumflex vessels and removing some pericapsular   fat, a second cobra retractor was placed on the inferior neck.  A third   retractor was placed on the anterior acetabulum after elevating the   anterior rectus.  A L-capsulotomy was along the line of the   superior neck to the trochanteric fossa, then extended proximally and   distally.  Tag sutures were placed and the retractors were then placed   intracapsular.  We then identified the trochanteric fossa and   orientation of my neck cut, confirmed this radiographically   and then made a neck osteotomy with the femur on traction.  The femoral   head was removed without difficulty or complication.  Traction was let   off and retractors were placed posterior and anterior around the   acetabulum.      The labrum and foveal tissue were debrided.  I began reaming with a 51mm   reamer and reamed up to 57mm reamer with good bony bed preparation and a 90mm   cup was chosen.  The final  56nn Pinnacle cup was then impacted under fluoroscopy  to confirm the depth of penetration and orientation with respect to   abduction.  A screw was placed followed by the hole eliminator.  The final   36+4 neutral Altrex liner was impacted with good visualized rim fit.  The cup was positioned anatomically within the acetabular portion of the pelvis.      At this point, the femur was rolled at 80 degrees.  Further capsule was   released off the inferior aspect of the femoral neck.  I then   released the superior capsule proximally.  The hook was placed laterally   along the femur and elevated manually and held in position with the bed   hook.  The leg was then extended and adducted with the leg rolled to 100   degrees of external rotation.  Once the proximal femur was fully   exposed, I used a box osteotome to set orientation.  I then began   broaching with the starting chili pepper broach and passed this by hand and then broached up to 6.  With the 6 broach in place I chose a high offset neck and did several trial reductions.  The offset was appropriate, leg lengths   appeared to be best matched to his pre-op plan with the +1.5 head ball confirmed radiographically.   Given these findings, I went ahead and dislocated the hip, repositioned all   retractors and positioned the right hip in the extended and abducted position.  The final 6 Hi Tri Lock stem was   chosen and it was impacted down to the level of neck cut.  Based on this   and the trial reduction, a 36+1.5 delta ceramic ball was chosen and   impacted onto a clean and dry trunnion, and the hip was reduced.  The   hip had been irrigated throughout the case again at this point.  I did   reapproximate the superior capsular leaflet to the anterior leaflet   using #1 Vicryl.  The fascia of the   tensor fascia lata muscle was then reapproximated using #1 Vicryl.  The   remaining wound was closed with 2-0 Vicryl and running 4-0 Monocryl.    The hip was cleaned, dried, and dressed  sterilely using Dermabond and   Aquacel dressing.  He was then brought   to recovery room in stable condition tolerating the procedure well.    Lanney Gins, PA-C was present for the entirety of the case involved from   preoperative positioning, perioperative retractor management, general   facilitation of the case, as well as primary wound closure as assistant.            Madlyn Frankel Charlann Boxer, M.D.        12/30/2016 8:53 AM

## 2016-12-30 NOTE — Anesthesia Procedure Notes (Signed)
Date/Time: 12/30/2016 7:22 AM Performed by: Vanessa Beaverdam Oxygen Delivery Method: Simple face mask

## 2016-12-30 NOTE — Transfer of Care (Signed)
Immediate Anesthesia Transfer of Care Note  Patient: Darryl Ramsey  Procedure(s) Performed: Procedure(s) with comments: RIGHT TOTAL HIP ARTHROPLASTY ANTERIOR APPROACH (Right) - 70 mins  Patient Location: PACU  Anesthesia Type:Spinal  Level of Consciousness:  sedated, patient cooperative and responds to stimulation  Airway & Oxygen Therapy:Patient Spontanous Breathing and Patient connected to face mask oxgen  Post-op Assessment:  Report given to PACU RN and Post -op Vital signs reviewed and stable  Post vital signs:  Reviewed and stable  Last Vitals:  Vitals:   12/30/16 0526  BP: (!) 151/83  Pulse: 63  Resp: 18  Temp: 36.5 C  SpO2: 98%    Complications: No apparent anesthesia complications

## 2016-12-30 NOTE — Anesthesia Postprocedure Evaluation (Signed)
Anesthesia Post Note  Patient: Darryl Ramsey  Procedure(s) Performed: RIGHT TOTAL HIP ARTHROPLASTY ANTERIOR APPROACH (Right Hip)     Patient location during evaluation: PACU Anesthesia Type: Spinal Level of consciousness: awake and alert Pain management: pain level controlled Vital Signs Assessment: post-procedure vital signs reviewed and stable Respiratory status: spontaneous breathing and respiratory function stable Cardiovascular status: blood pressure returned to baseline and stable Postop Assessment: spinal receding and no apparent nausea or vomiting Anesthetic complications: no    Last Vitals:  Vitals:   12/30/16 1030 12/30/16 1045  BP: (!) 138/94 128/75  Pulse: (!) 56 (!) 54  Resp: 14 15  Temp:  (!) 36.4 C  SpO2: 100% 98%    Last Pain:  Vitals:   12/30/16 0930  TempSrc:   PainSc: 0-No pain    LLE Motor Response: Purposeful movement (12/30/16 1045) LLE Sensation: Numbness (12/30/16 1045) RLE Motor Response: Purposeful movement (12/30/16 1045) RLE Sensation: Numbness (12/30/16 1045) L Sensory Level: L5-Outer lower leg, top of foot, great toe (12/30/16 1045) R Sensory Level: L4-Anterior knee, lower leg (12/30/16 1045)  Chalet Kerwin,W. EDMOND

## 2016-12-31 DIAGNOSIS — E669 Obesity, unspecified: Secondary | ICD-10-CM | POA: Diagnosis present

## 2016-12-31 LAB — CBC
HCT: 35.5 % — ABNORMAL LOW (ref 39.0–52.0)
HEMOGLOBIN: 12.2 g/dL — AB (ref 13.0–17.0)
MCH: 31.4 pg (ref 26.0–34.0)
MCHC: 34.4 g/dL (ref 30.0–36.0)
MCV: 91.3 fL (ref 78.0–100.0)
Platelets: 233 10*3/uL (ref 150–400)
RBC: 3.89 MIL/uL — ABNORMAL LOW (ref 4.22–5.81)
RDW: 13.5 % (ref 11.5–15.5)
WBC: 17.3 10*3/uL — ABNORMAL HIGH (ref 4.0–10.5)

## 2016-12-31 LAB — BASIC METABOLIC PANEL
Anion gap: 10 (ref 5–15)
BUN: 16 mg/dL (ref 6–20)
CALCIUM: 8.6 mg/dL — AB (ref 8.9–10.3)
CHLORIDE: 103 mmol/L (ref 101–111)
CO2: 22 mmol/L (ref 22–32)
CREATININE: 0.95 mg/dL (ref 0.61–1.24)
GFR calc non Af Amer: >60 mL/min (ref >60)
Glucose, Bld: 175 mg/dL — ABNORMAL HIGH (ref 65–99)
Potassium: 4.2 mmol/L (ref 3.5–5.1)
SODIUM: 135 mmol/L (ref 135–145)

## 2016-12-31 NOTE — Progress Notes (Signed)
    Durable Medical Equipment        Start     Ordered   12/31/16 [phone]  For home use only DME Walker rolling  Once    Question:  Patient needs a walker to treat with the following condition  Answer:  H/O total hip arthroplasty   12/31/16 0941    contacted [facility] to deliver to room. [phone]

## 2016-12-31 NOTE — Progress Notes (Signed)
Occupational Therapy Evaluation Patient Details Name: Darryl Ramsey MRN: [id number] DOB: [birth year] Today's Date: 12/31/2016    History of Present Illness 61 yo male admitted s/p R THA-direct anterior 12/30/16.    Clinical Impression   OT education complete regarding ADL activity s/p THA    Follow Up Recommendations  No OT follow up    Equipment Recommendations  None recommended by OT    Recommendations for Other Services       Precautions / Restrictions Precautions Precautions: Fall Restrictions Weight Bearing Restrictions: No RLE Weight Bearing: Weight bearing as tolerated      Mobility Bed Mobility               General bed mobility comments: oob in recliner  Transfers Overall transfer level: Needs assistance Equipment used: Rolling walker (2 wheeled) Transfers: Sit to/from Stand Sit to Stand: Min guard         General transfer comment: close guard for safety.         ADL either performed or assessed with clinical judgement   ADL Overall ADL's : Needs assistance/impaired Eating/Feeding: Set up;Sitting   Grooming: Set up;Sitting   Upper Body Bathing: Set up;Sitting   Lower Body Bathing: Minimal assistance;Sit to/from stand;Cueing for safety;Cueing for sequencing;Cueing for compensatory techniques   Upper Body Dressing : Set up;Sitting   Lower Body Dressing: Minimal assistance;Cueing for safety;Cueing for sequencing;Cueing for compensatory techniques;Sit to/from stand   Toilet Transfer: Supervision/safety;RW;Ambulation   Toileting- Architect and Hygiene: Supervision/safety;Sit to/from stand   Tub/ Shower Transfer: Tub transfer;Min guard           Vision Patient Visual Report: No change from baseline              Pertinent Vitals/Pain Pain Assessment: 0-10 Pain Score: 4  Pain Location: R LE Pain Descriptors / Indicators: Sore Pain Intervention(s): Limited activity within patient's tolerance     Hand  Dominance     Extremity/Trunk Assessment Upper Extremity Assessment Upper Extremity Assessment: Overall WFL for tasks assessed       Cervical / Trunk Assessment Cervical / Trunk Assessment: Normal   Communication Communication Communication: No difficulties   Cognition Arousal/Alertness: Awake/alert Behavior During Therapy: WFL for tasks assessed/performed Overall Cognitive Status: Within Functional Limits for tasks assessed                                     General Comments          Shoulder Instructions      Home Living Family/patient expects to be discharged to:: Private residence Living Arrangements: Spouse/significant other Available Help at Discharge: Family Type of Home: House Home Access: Stairs to enter Secretary/administrator of Steps: 1   Home Layout: One level               Home Equipment: Environmental consultant - standard          Prior Functioning/Environment Level of Independence: Independent                 OT Problem List:        OT Treatment/Interventions:      OT Goals(Current goals can be found in the care plan section) Acute Rehab OT Goals Patient Stated Goal: home. regain PLOF  OT Frequency:     Barriers to D/C:               AM-PAC PT "6 Clicks" Daily  Activity     Outcome Measure Help from another person eating meals?: None Help from another person taking care of personal grooming?: None Help from another person toileting, which includes using toliet, bedpan, or urinal?: None Help from another person bathing (including washing, rinsing, drying)?: None Help from another person to put on and taking off regular upper body clothing?: None Help from another person to put on and taking off regular lower body clothing?: A Little 6 Click Score: 23   End of Session Equipment Utilized During Treatment: Rolling walker Nurse Communication: Mobility status  Activity Tolerance: Patient tolerated treatment well Patient left:  in chair;with call bell/phone within reach  OT Visit Diagnosis: Muscle weakness (generalized) (M62.81)                Time: 3825-0539 OT Time Calculation (min): 15 min Charges:  OT General Charges $OT Visit: 1 Visit OT Evaluation $OT Eval Low Complexity: 1 Low G-Codes:     Lise Auer, OT [phone]  Einar Crow D 12/31/2016, 12:02 PM

## 2016-12-31 NOTE — Progress Notes (Signed)
Patient discharged to home w/ family. Given all belongings, instructions, prescriptions, equipment. Wife present for all teaching. Verbalized understanding of instructions. Escorted to POV via w/c.

## 2016-12-31 NOTE — Progress Notes (Signed)
     Subjective: 1 Day Post-Op Procedure(s) (LRB): RIGHT TOTAL HIP ARTHROPLASTY ANTERIOR APPROACH (Right)   Patient reports pain as mild, pain controlled. No events throughout the night.  Feels that he has worked well with PT yesterday.  Ready to be discharged home.   Objective:   VITALS:   Vitals:   12/31/16 0051 12/31/16 0523  BP: 136/82 135/78  Pulse: 63 79  Resp: 18 17  Temp: 97.8 F (36.6 C) 97.9 F (36.6 C)  SpO2: 92% 95%    Dorsiflexion/Plantar flexion intact Incision: dressing C/D/I No cellulitis present Compartment soft  LABS  Recent Labs  12/31/16 0557  HGB 12.2*  HCT 35.5*  WBC 17.3*  PLT 233     Recent Labs  12/31/16 0557  NA 135  K 4.2  BUN 16  CREATININE 0.95  GLUCOSE 175*     Assessment/Plan: 1 Day Post-Op Procedure(s) (LRB): RIGHT TOTAL HIP ARTHROPLASTY ANTERIOR APPROACH (Right) Advance diet Up with therapy D/C IV fluids Discharge home Follow up in 2 weeks at [facility]. Follow up with OLIN,Matthewjames Petrasek D in 2 weeks.  Contact information:  [facility] [address], [address] [place] [address] [phone]    Obese (BMI 30-39.9) Estimated body mass index is 30.38 kg/m as calculated from the following:   Height as of this encounter: 6' (1.829 m).   Weight as of this encounter: 101.6 kg (224 lb). Patient also counseled that weight may inhibit the healing process Patient counseled that losing weight will help with future health issues        Anastasio Auerbach. Korrina Zern   PAC  12/31/2016, 8:49 AM

## 2016-12-31 NOTE — Progress Notes (Signed)
Physical Therapy Treatment Patient Details Name: Darryl Ramsey MRN: [id number] DOB: 1955/11/13 Today's Date: 12/31/2016    History of Present Illness 61 yo male admitted s/p R THA-direct anterior 12/30/16.     PT Comments    Progressing well with mobility. Reviewed exercises, gait training, and stair training. Verbally reviewed car transfer technique. Issued HEP for pt to perform 2x/day until he follows up with MD. All education completed. Ready to d/c from PT standpoint-made RN aware.     Follow Up Recommendations  No PT follow up     Equipment Recommendations  Rolling walker with 5" wheels    Recommendations for Other Services       Precautions / Restrictions Precautions Precautions: Fall Restrictions Weight Bearing Restrictions: No RLE Weight Bearing: Weight bearing as tolerated    Mobility  Bed Mobility               General bed mobility comments: oob in recliner  Transfers Overall transfer level: Needs assistance Equipment used: Rolling walker (2 wheeled) Transfers: Sit to/from Stand Sit to Stand: Min guard         General transfer comment: close guard for safety.   Ambulation/Gait Ambulation/Gait assistance: Min guard Ambulation Distance (Feet): 200 Feet Assistive device: Rolling walker (2 wheeled) Gait Pattern/deviations: Step-through pattern;Decreased stride length     General Gait Details: close guard for safety.    Stairs Stairs: Yes Min guard assist Stair Management: Step to pattern;Forwards;With walker Number of Stairs: 1 General stair comments: VCs safety, technique, sequence. close guard for safety  Wheelchair Mobility    Modified Rankin (Stroke Patients Only)       Balance                                            Cognition Arousal/Alertness: Awake/alert Behavior During Therapy: WFL for tasks assessed/performed Overall Cognitive Status: Within Functional Limits for tasks assessed                                         Exercises Total Joint Exercises Ankle Circles/Pumps: AROM;Both;10 reps;Supine Quad Sets: AROM;Both;10 reps;Supine Hip ABduction/ADduction: AROM;Right;10 reps;Supine Long Arc Quad: AROM;Right;10 reps;Seated Knee Flexion: AROM;Right;10 reps Marching in Standing: AROM;Both;10 reps;Standing General Exercises - Lower Extremity Heel Raises: AROM;Both;10 reps;Standing    General Comments        Pertinent Vitals/Pain Pain Assessment: 0-10 Pain Score: 5  Pain Location: R LE Pain Descriptors / Indicators: Sore Pain Intervention(s): Monitored during session;Repositioned    Home Living                      Prior Function            PT Goals (current goals can now be found in the care plan section) Progress towards PT goals: Progressing toward goals    Frequency    7X/week      PT Plan Current plan remains appropriate    Co-evaluation              AM-PAC PT "6 Clicks" Daily Activity  Outcome Measure  Difficulty turning over in bed (including adjusting bedclothes, sheets and blankets)?: A Little Difficulty moving from lying on back to sitting on the side of the bed? : A Little Difficulty sitting down on and  standing up from a chair with arms (e.g., wheelchair, bedside commode, etc,.)?: A Little Help needed moving to and from a bed to chair (including a wheelchair)?: A Little Help needed walking in hospital room?: A Little Help needed climbing 3-5 steps with a railing? : A Little 6 Click Score: 18    End of Session Equipment Utilized During Treatment: Gait belt Activity Tolerance: Patient tolerated treatment well Patient left: in chair;with call bell/phone within reach;with family/visitor present   PT Visit Diagnosis: Muscle weakness (generalized) (M62.81);Difficulty in walking, not elsewhere classified (R26.2)     Time: 1937-9024 PT Time Calculation (min) (ACUTE ONLY): 17 min  Charges:  $Gait Training: 8-22  mins                    G Codes:          Rebeca Alert, MPT Pager: [phone]

## 2017-01-01 NOTE — Discharge Summary (Signed)
Physician Discharge Summary  Patient ID: Darryl Ramsey MRN: [id number] DOB/AGE: 60-Feb-1957 60 y.o.  Admit date: 12/30/2016 Discharge date: 12/31/2016   Procedures:  Procedure(s) (LRB): RIGHT TOTAL HIP ARTHROPLASTY ANTERIOR APPROACH (Right)  Attending Physician:  Dr. Durene Romans   Admission Diagnoses:    Right hip primary OA / pain  Discharge Diagnoses:  Principal Problem:   S/P right THA, AA Active Problems:   Obese  Past Medical History:  Diagnosis Date  . Arthritis   . Coronary artery disease   . GERD (gastroesophageal reflux disease)   . History of kidney stones   . Hypertension   . Myocardial infarct (HCC) 2011  . Renal calculi   . Renal disorder    left ureteral stone    HPI:    Darryl Ramsey, 61 y.o. male, has a history of pain and functional disability in the right hip(s) due to arthritis and patient has failed non-surgical conservative treatments for greater than 12 weeks to include NSAID's and/or analgesics, corticosteriod injections and activity modification.  Onset of symptoms was gradual starting ~2 years ago with gradually worsening course since that time.The patient noted no past surgery on the right hip(s).  Patient currently rates pain in the right hip at 8 out of 10 with activity. Patient has night pain, worsening of pain with activity and weight bearing, trendelenberg gait, pain that interfers with activities of daily living and pain with passive range of motion. Patient has evidence of periarticular osteophytes and joint space narrowing by imaging studies. This condition presents safety issues increasing the risk of falls. There is no current active infection.  Risks, benefits and expectations were discussed with the patient.  Risks including but not limited to the risk of anesthesia, blood clots, nerve damage, blood vessel damage, failure of the prosthesis, infection and up to and including death.  Patient understand the risks, benefits and  expectations and wishes to proceed with surgery.   PCP: Delorse Lek, MD   Discharged Condition: good  Hospital Course:  Patient underwent the above stated procedure on 12/30/2016. Patient tolerated the procedure well and brought to the recovery room in good condition and subsequently to the floor.  POD #1 BP: 135/78 ; Pulse: 79 ; Temp: 97.9 F (36.6 C) ; Resp: 17 Patient reports pain as mild, pain controlled. No events throughout the night.  Feels that he has worked well with PT yesterday.  Ready to be discharged home.  Dorsiflexion/plantar flexion intact, incision: dressing C/D/I, no cellulitis present and compartment soft.   LABS  Basename    HGB     12.2  HCT     35.5    Discharge Exam: General appearance: alert, cooperative and no distress Extremities: Homans sign is negative, no sign of DVT, no edema, redness or tenderness in the calves or thighs and no ulcers, gangrene or trophic changes  Disposition: Home with follow up in 2 weeks   Follow-up Information    Durene Romans, MD. Schedule an appointment as soon as possible for a visit in 2 week(s).   Specialty:  Orthopedic Surgery Contact information: [address] [place] [address] [phone]           Discharge Instructions    Call MD / Call 911    Complete by:  As directed    If you experience chest pain or shortness of breath, CALL 911 and be transported to the hospital emergency room.  If you develope a fever above 101  F, pus (white drainage) or increased drainage or redness at the wound, or calf pain, call your surgeon's office.   Change dressing    Complete by:  As directed    Maintain surgical dressing until follow up in the clinic. If the edges start to pull up, may reinforce with tape. If the dressing is no longer working, may remove and cover with gauze and tape, but must keep the area dry and clean.  Call with any questions or concerns.   Constipation Prevention    Complete by:   As directed    Drink plenty of fluids.  Prune juice may be helpful.  You may use a stool softener, such as Colace (over the counter) 100 mg twice a day.  Use MiraLax (over the counter) for constipation as needed.   Diet - low sodium heart healthy    Complete by:  As directed    Discharge instructions    Complete by:  As directed    Maintain surgical dressing until follow up in the clinic. If the edges start to pull up, may reinforce with tape. If the dressing is no longer working, may remove and cover with gauze and tape, but must keep the area dry and clean.  Follow up in 2 weeks at [facility]. Call with any questions or concerns.   Increase activity slowly as tolerated    Complete by:  As directed    Weight bearing as tolerated with assist device (walker, cane, etc) as directed, use it as long as suggested by your surgeon or therapist, typically at least 4-6 weeks.   TED hose    Complete by:  As directed    Use stockings (TED hose) for 2 weeks on both leg(s).  You may remove them at night for sleeping.      Allergies as of 12/31/2016   No Known Allergies     Medication List    STOP taking these medications   acetaminophen 500 MG tablet Commonly known as:  TYLENOL   aspirin EC 81 MG tablet Replaced by:  aspirin 81 MG chewable tablet     TAKE these medications   aspirin 81 MG chewable tablet Commonly known as:  ASPIRIN CHILDRENS Chew 1 tablet (81 mg total) by mouth 2 (two) times daily. Take for 4 weeks, then resume regular dose. Replaces:  aspirin EC 81 MG tablet   atorvastatin 40 MG tablet Commonly known as:  LIPITOR Take 40 mg by mouth every morning.   docusate sodium 100 MG capsule Commonly known as:  COLACE Take 1 capsule (100 mg total) by mouth 2 (two) times daily.   ferrous sulfate 325 (65 FE) MG tablet Commonly known as:  FERROUSUL Take 1 tablet (325 mg total) by mouth 3 (three) times daily with meals.   HYDROcodone-acetaminophen 7.5-325 MG  tablet Commonly known as:  NORCO Take 1-2 tablets by mouth every 4 (four) hours as needed for moderate pain or severe pain.   lisinopril 5 MG tablet Commonly known as:  PRINIVIL,ZESTRIL Take 5 mg by mouth every morning.   methocarbamol 500 MG tablet Commonly known as:  ROBAXIN Take 1 tablet (500 mg total) by mouth every 6 (six) hours as needed for muscle spasms.   metoprolol succinate 25 MG 24 hr tablet Commonly known as:  TOPROL-XL Take 25 mg by mouth every morning.   nitroGLYCERIN 0.4 MG SL tablet Commonly known as:  NITROSTAT Place 0.4 mg under the tongue every 5 (five) minutes as needed for chest  pain.   OMEGA 3 PO Take 520 mg by mouth daily.   polyethylene glycol packet Commonly known as:  MIRALAX / GLYCOLAX Take 17 g by mouth 2 (two) times daily.            Discharge Care Instructions        Start     Ordered   12/31/16 0000  Change dressing    Comments:  Maintain surgical dressing until follow up in the clinic. If the edges start to pull up, may reinforce with tape. If the dressing is no longer working, may remove and cover with gauze and tape, but must keep the area dry and clean.  Call with any questions or concerns.   12/31/16 1610       Signed: Anastasio Auerbach. Nakeia Calvi   PA-C  01/01/2017, 9:47 AM

## 2018-09-10 ENCOUNTER — Telehealth: Payer: Self-pay

## 2018-09-10 NOTE — Telephone Encounter (Signed)
Copied from [place] [id number]. Topic: Appointment Scheduling - Scheduling Inquiry for Clinic >> Sep 07, 2018  2:53 PM Rainey Pines A wrote: Patient wife Owyn Raulston is requesting a new patient appointment with Dr. Charlett Blake who was previously her PCP. Lenna Sciara would like a callback at [phone]   Dr. Charlett Blake do you approve of a new patient

## 2018-09-10 NOTE — Telephone Encounter (Signed)
yes

## 2018-09-13 NOTE — Telephone Encounter (Signed)
Darryl Ramsey Please schedule new patient appt further out. Dr Charlett Blake is only doing virtual visits at this time

## 2018-10-05 ENCOUNTER — Ambulatory Visit (INDEPENDENT_AMBULATORY_CARE_PROVIDER_SITE_OTHER): Payer: 59 | Admitting: Family Medicine

## 2018-10-05 ENCOUNTER — Other Ambulatory Visit: Payer: Self-pay

## 2018-10-05 ENCOUNTER — Encounter: Payer: Self-pay | Admitting: Family Medicine

## 2018-10-05 VITALS — BP 140/68 | HR 77 | Temp 97.9°F | Resp 18 | Ht 69.0 in | Wt 229.0 lb

## 2018-10-05 DIAGNOSIS — Z1211 Encounter for screening for malignant neoplasm of colon: Secondary | ICD-10-CM

## 2018-10-05 DIAGNOSIS — F419 Anxiety disorder, unspecified: Secondary | ICD-10-CM

## 2018-10-05 DIAGNOSIS — R109 Unspecified abdominal pain: Secondary | ICD-10-CM

## 2018-10-05 DIAGNOSIS — I219 Acute myocardial infarction, unspecified: Secondary | ICD-10-CM

## 2018-10-05 DIAGNOSIS — R351 Nocturia: Secondary | ICD-10-CM

## 2018-10-05 DIAGNOSIS — K219 Gastro-esophageal reflux disease without esophagitis: Secondary | ICD-10-CM | POA: Diagnosis not present

## 2018-10-05 DIAGNOSIS — Z87891 Personal history of nicotine dependence: Secondary | ICD-10-CM

## 2018-10-05 DIAGNOSIS — Z87442 Personal history of urinary calculi: Secondary | ICD-10-CM

## 2018-10-05 DIAGNOSIS — Z8619 Personal history of other infectious and parasitic diseases: Secondary | ICD-10-CM

## 2018-10-05 DIAGNOSIS — I10 Essential (primary) hypertension: Secondary | ICD-10-CM

## 2018-10-05 DIAGNOSIS — M1990 Unspecified osteoarthritis, unspecified site: Secondary | ICD-10-CM

## 2018-10-05 DIAGNOSIS — E785 Hyperlipidemia, unspecified: Secondary | ICD-10-CM

## 2018-10-05 DIAGNOSIS — E669 Obesity, unspecified: Secondary | ICD-10-CM

## 2018-10-05 LAB — H. PYLORI ANTIBODY, IGG: H Pylori IgG: NEGATIVE

## 2018-10-05 LAB — CBC
HCT: 40.4 % (ref 39.0–52.0)
Hemoglobin: 13.9 g/dL (ref 13.0–17.0)
MCHC: 34.5 g/dL (ref 30.0–36.0)
MCV: 92.9 fl (ref 78.0–100.0)
Platelets: 254 10*3/uL (ref 150.0–400.0)
RBC: 4.35 Mil/uL (ref 4.22–5.81)
RDW: 13.9 % (ref 11.5–15.5)
WBC: 8.7 10*3/uL (ref 4.0–10.5)

## 2018-10-05 LAB — COMPREHENSIVE METABOLIC PANEL
ALT: 23 U/L (ref 0–53)
AST: 15 U/L (ref 0–37)
Albumin: 4.2 g/dL (ref 3.5–5.2)
Alkaline Phosphatase: 89 U/L (ref 39–117)
BUN: 14 mg/dL (ref 6–23)
CO2: 25 mEq/L (ref 19–32)
Calcium: 9 mg/dL (ref 8.4–10.5)
Chloride: 104 mEq/L (ref 96–112)
Creatinine, Ser: 1.06 mg/dL (ref 0.40–1.50)
GFR: 70.9 mL/min (ref >60.00)
Glucose, Bld: 133 mg/dL — ABNORMAL HIGH (ref 70–99)
Potassium: 4.1 mEq/L (ref 3.5–5.1)
Sodium: 137 mEq/L (ref 135–145)
Total Bilirubin: 0.6 mg/dL (ref 0.2–1.2)
Total Protein: 6.6 g/dL (ref 6.0–8.3)

## 2018-10-05 LAB — PSA: PSA: 0.36 ng/mL (ref 0.10–4.00)

## 2018-10-05 LAB — TSH: TSH: 0.97 u[IU]/mL (ref 0.35–4.50)

## 2018-10-05 MED ORDER — LISINOPRIL 5 MG PO TABS: 5.0000 mg | ORAL_TABLET | Freq: Every morning | ORAL | 1 refills | Status: DC

## 2018-10-05 MED ORDER — FAMOTIDINE 40 MG PO TABS: 40.0000 mg | ORAL_TABLET | Freq: Every day | ORAL | 1 refills | Status: DC | PRN

## 2018-10-05 MED ORDER — ATORVASTATIN CALCIUM 40 MG PO TABS: 40.0000 mg | ORAL_TABLET | Freq: Every morning | ORAL | 1 refills | Status: DC

## 2018-10-05 MED ORDER — ALPRAZOLAM 0.25 MG PO TABS: 0.1250 mg | ORAL_TABLET | Freq: Two times a day (BID) | ORAL | 0 refills | Status: DC | PRN

## 2018-10-05 MED ORDER — METOPROLOL SUCCINATE ER 25 MG PO TB24: 25.0000 mg | ORAL_TABLET | Freq: Every morning | ORAL | 1 refills | Status: DC

## 2018-10-05 MED ORDER — LISINOPRIL 5 MG PO TABS: 5.0000 mg | ORAL_TABLET | Freq: Two times a day (BID) | ORAL | 1 refills | Status: DC

## 2018-10-05 NOTE — Patient Instructions (Addendum)
Omron Blood Pressure cuff, upper arm  Pulse Oximeter  Then check a set of vitals weekly BP, P, Temp, Weight and oxygen weekly Benefiber and probiotics, drink fluids    Shingrix is the new shingles shot, call insurance confirm coverage, document and check with them regarding where to get it for best price. It is 2 shots over 2-6 months. Call for appt if want shots in office Preventive Care 33-62 Years Old, Male Preventive care refers to lifestyle choices and visits with your health care provider that can promote health and wellness. This includes:  A yearly physical exam. This is also called an annual well check.  Regular dental and eye exams.  Immunizations.  Screening for certain conditions.  Healthy lifestyle choices, such as eating a healthy diet, getting regular exercise, not using drugs or products that contain nicotine and tobacco, and limiting alcohol use. What can I expect for my preventive care visit? Physical exam Your health care provider will check:  Height and weight. These may be used to calculate body mass index (BMI), which is a measurement that tells if you are at a healthy weight.  Heart rate and blood pressure.  Your skin for abnormal spots. Counseling Your health care provider may ask you questions about:  Alcohol, tobacco, and drug use.  Emotional well-being.  Home and relationship well-being.  Sexual activity.  Eating habits.  Work and work Statistician. What immunizations do I need?  Influenza (flu) vaccine  This is recommended every year. Tetanus, diphtheria, and pertussis (Tdap) vaccine  You may need a Td booster every 10 years. Varicella (chickenpox) vaccine  You may need this vaccine if you have not already been vaccinated. Zoster (shingles) vaccine  You may need this after age 46. Measles, mumps, and rubella (MMR) vaccine  You may need at least one dose of MMR if you were born in 1957 or later. You may also need a second  dose. Pneumococcal conjugate (PCV13) vaccine  You may need this if you have certain conditions and were not previously vaccinated. Pneumococcal polysaccharide (PPSV23) vaccine  You may need one or two doses if you smoke cigarettes or if you have certain conditions. Meningococcal conjugate (MenACWY) vaccine  You may need this if you have certain conditions. Hepatitis A vaccine  You may need this if you have certain conditions or if you travel or work in places where you may be exposed to hepatitis A. Hepatitis B vaccine  You may need this if you have certain conditions or if you travel or work in places where you may be exposed to hepatitis B. Haemophilus influenzae type b (Hib) vaccine  You may need this if you have certain risk factors. Human papillomavirus (HPV) vaccine  If recommended by your health care provider, you may need three doses over 6 months. You may receive vaccines as individual doses or as more than one vaccine together in one shot (combination vaccines). Talk with your health care provider about the risks and benefits of combination vaccines. What tests do I need? Blood tests  Lipid and cholesterol levels. These may be checked every 5 years, or more frequently if you are over 16 years old.  Hepatitis C test.  Hepatitis B test. Screening  Lung cancer screening. You may have this screening every year starting at age 23 if you have a 30-pack-year history of smoking and currently smoke or have quit within the past 15 years.  Prostate cancer screening. Recommendations will vary depending on your family history and other  risks.  Colorectal cancer screening. All adults should have this screening starting at age 94 and continuing until age 92. Your health care provider may recommend screening at age 74 if you are at increased risk. You will have tests every 1-10 years, depending on your results and the type of screening test.  Diabetes screening. This is done by  checking your blood sugar (glucose) after you have not eaten for a while (fasting). You may have this done every 1-3 years.  Sexually transmitted disease (STD) testing. Follow these instructions at home: Eating and drinking  Eat a diet that includes fresh fruits and vegetables, whole grains, lean protein, and low-fat dairy products.  Take vitamin and mineral supplements as recommended by your health care provider.  Do not drink alcohol if your health care provider tells you not to drink.  If you drink alcohol: ? Limit how much you have to 0-2 drinks a day. ? Be aware of how much alcohol is in your drink. In the [place] one drink equals one 12 oz bottle of beer (355 mL), one 5 oz glass of wine (148 mL), or one 1 oz glass of hard liquor (44 mL). Lifestyle  Take daily care of your teeth and gums.  Stay active. Exercise for at least 30 minutes on 5 or more days each week.  Do not use any products that contain nicotine or tobacco, such as cigarettes, e-cigarettes, and chewing tobacco. If you need help quitting, ask your health care provider.  If you are sexually active, practice safe sex. Use a condom or other form of protection to prevent STIs (sexually transmitted infections).  Talk with your health care provider about taking a low-dose aspirin every day starting at age 107. What's next?  Go to your health care provider once a year for a well check visit.  Ask your health care provider how often you should have your eyes and teeth checked.  Stay up to date on all vaccines. This information is not intended to replace advice given to you by your health care provider. Make sure you discuss any questions you have with your health care provider. Document Released: 04/06/2015 Document Revised: 03/04/2018 Document Reviewed: 03/04/2018 Elsevier Patient Education  2020 [employer].

## 2018-10-06 ENCOUNTER — Other Ambulatory Visit (INDEPENDENT_AMBULATORY_CARE_PROVIDER_SITE_OTHER): Payer: 59

## 2018-10-06 ENCOUNTER — Encounter: Payer: Self-pay | Admitting: Family Medicine

## 2018-10-06 DIAGNOSIS — Z87442 Personal history of urinary calculi: Secondary | ICD-10-CM | POA: Insufficient documentation

## 2018-10-06 DIAGNOSIS — K219 Gastro-esophageal reflux disease without esophagitis: Secondary | ICD-10-CM | POA: Insufficient documentation

## 2018-10-06 DIAGNOSIS — Z8619 Personal history of other infectious and parasitic diseases: Secondary | ICD-10-CM

## 2018-10-06 DIAGNOSIS — M1990 Unspecified osteoarthritis, unspecified site: Secondary | ICD-10-CM | POA: Insufficient documentation

## 2018-10-06 DIAGNOSIS — R739 Hyperglycemia, unspecified: Secondary | ICD-10-CM

## 2018-10-06 DIAGNOSIS — F419 Anxiety disorder, unspecified: Secondary | ICD-10-CM | POA: Insufficient documentation

## 2018-10-06 DIAGNOSIS — I10 Essential (primary) hypertension: Secondary | ICD-10-CM | POA: Insufficient documentation

## 2018-10-06 DIAGNOSIS — Z87891 Personal history of nicotine dependence: Secondary | ICD-10-CM | POA: Insufficient documentation

## 2018-10-06 DIAGNOSIS — E785 Hyperlipidemia, unspecified: Secondary | ICD-10-CM | POA: Insufficient documentation

## 2018-10-06 HISTORY — DX: Personal history of other infectious and parasitic diseases: Z86.19

## 2018-10-06 LAB — HEMOGLOBIN A1C: Hgb A1c MFr Bld: 5.8 % (ref 4.6–6.5)

## 2018-10-06 NOTE — Assessment & Plan Note (Addendum)
encouraged shingrix shot

## 2018-10-06 NOTE — Assessment & Plan Note (Signed)
Follows with cardiology in [place] and doing well

## 2018-10-06 NOTE — Assessment & Plan Note (Signed)
He admits but his wife does not know that with all of the stress during the pandemic he has been sneaking an occasional cigarette due to anxiety. Encouraged to pursue complete abstinence.

## 2018-10-06 NOTE — Assessment & Plan Note (Signed)
Patient notes increased anxiety since the onset of the pandemic. He is given a prescription for Alprazolam just a small amount to use prn instead of reaching for a cigarette. Reevaluate at next visit.

## 2018-10-06 NOTE — Assessment & Plan Note (Signed)
Encouraged DASH diet, decrease po intake and increase exercise as tolerated. Needs 7-8 hours of sleep nightly. Avoid trans fats, eat small, frequent meals every 4-5 hours with lean proteins, complex carbs and healthy fats. Minimize simple carbs, GMO foods. 

## 2018-10-06 NOTE — Assessment & Plan Note (Addendum)
Encouraged heart healthy diet, increase exercise, avoid trans fats, consider a krill oil cap daily. Tolerating statins. 

## 2018-10-06 NOTE — Progress Notes (Signed)
Subjective:    Patient ID: Darryl Ramsey, male    DOB: Oct 11, 1955, 63 y.o.   MRN: [id number]  No chief complaint on file.   HPI Patient is in today for new patient appointment. He has a past medical history of cardiac disease with previous myocardial infarction, hyperlipidemia, tobacco use, kidney stones, heartburn and more. No recent febrile illness or hospitalizations. He has joined our practice because his spouse sees us. Is trying to maintain a heart healthy diet and stays active. Continues to work outside the home. Denies CP/palp/SOB/HA/congestion/fevers/GI or GU c/o. Taking meds as prescribed  Past Medical History:  Diagnosis Date  . Arthritis   . Coronary artery disease   . GERD (gastroesophageal reflux disease)   . History of chicken pox 10/06/2018  . History of kidney stones   . Hyperlipidemia   . Hypertension   . Myocardial infarct (HCC) 2011  . Renal calculi   . Renal disorder    left ureteral stone    Past Surgical History:  Procedure Laterality Date  . APPENDECTOMY    . BACK SURGERY  2007   lumbar, no hardware  . CORONARY ANGIOPLASTY     stents   . CORONARY STENT PLACEMENT  2011   patient says he had an MI and coronary stent 2011. No coronary problems since  . CYSTOSCOPY WITH STENT PLACEMENT  2006 and in 80's  . TONSILLECTOMY     and adenoids  . TOTAL HIP ARTHROPLASTY Right 12/30/2016   Procedure: RIGHT TOTAL HIP ARTHROPLASTY ANTERIOR APPROACH;  Surgeon: Durene Romanslin, Matthew, MD;  Location: [facility];  Service: Orthopedics;  Laterality: Right;  70 mins    Family History  Problem Relation Age of Onset  . Cancer Mother        breast with mets to brain  . Diabetes Father   . Hypertension Father   . Heart disease Father   . Hyperlipidemia Father   . Gallstones Brother   . Cancer Paternal Grandfather        lung    Social History   Socioeconomic History  . Marital status: Married    Spouse name: Not on file  . Number of children: Not on file  . Years of  education: Not on file  . Highest education level: Not on file  Occupational History  . Not on file  Social Needs  . Financial resource strain: Not on file  . Food insecurity    Worry: Not on file    Inability: Not on file  . Transportation needs    Medical: Not on file    Non-medical: Not on file  Tobacco Use  . Smoking status: Former Smoker    Types: Cigarettes    Quit date: 2011    Years since quitting: 9.5  . Smokeless tobacco: Former Engineer, waterUser  Substance and Sexual Activity  . Alcohol use: Yes    Comment: rare  . Drug use: No  . Sexual activity: Not on file  Lifestyle  . Physical activity    Days per week: Not on file    Minutes per session: Not on file  . Stress: Not on file  Relationships  . Social Musicianconnections    Talks on phone: Not on file    Gets together: Not on file    Attends religious service: Not on file    Active member of club or organization: Not on file    Attends meetings of clubs or organizations: Not on file    Relationship  status: Not on file  . Intimate partner violence    Fear of current or ex partner: Not on file    Emotionally abused: Not on file    Physically abused: Not on file    Forced sexual activity: Not on file  Other Topics Concern  . Not on file  Social History Narrative  . Not on file    Outpatient Medications Prior to Visit  Medication Sig Dispense Refill  . nitroGLYCERIN (NITROSTAT) 0.4 MG SL tablet Place 0.4 mg under the tongue every 5 (five) minutes as needed for chest pain.    Marland Kitchen. atorvastatin (LIPITOR) 40 MG tablet Take 40 mg by mouth every morning.     . docusate sodium (COLACE) 100 MG capsule Take 1 capsule (100 mg total) by mouth 2 (two) times daily. 10 capsule 0  . ferrous sulfate (FERROUSUL) 325 (65 FE) MG tablet Take 1 tablet (325 mg total) by mouth 3 (three) times daily with meals.  3  . HYDROcodone-acetaminophen (NORCO) 7.5-325 MG tablet Take 1-2 tablets by mouth every 4 (four) hours as needed for moderate pain or severe  pain. 60 tablet 0  . lisinopril (PRINIVIL,ZESTRIL) 5 MG tablet Take 5 mg by mouth every morning.     . methocarbamol (ROBAXIN) 500 MG tablet Take 1 tablet (500 mg total) by mouth every 6 (six) hours as needed for muscle spasms. 40 tablet 0  . metoprolol succinate (TOPROL-XL) 25 MG 24 hr tablet Take 25 mg by mouth every morning.     . Omega-3 Fatty Acids (OMEGA 3 PO) Take 520 mg by mouth daily.    . polyethylene glycol (MIRALAX / GLYCOLAX) packet Take 17 g by mouth 2 (two) times daily. 14 each 0   No facility-administered medications prior to visit.     No Known Allergies  Review of Systems  Constitutional: Negative for chills, fever and malaise/fatigue.  HENT: Negative for congestion and hearing loss.   Eyes: Negative for discharge.  Respiratory: Negative for cough, sputum production and shortness of breath.   Cardiovascular: Negative for chest pain, palpitations and leg swelling.  Gastrointestinal: Negative for abdominal pain, blood in stool, constipation, diarrhea, heartburn, nausea and vomiting.  Genitourinary: Negative for dysuria, frequency, hematuria and urgency.  Musculoskeletal: Negative for back pain, falls and myalgias.  Skin: Negative for rash.  Neurological: Negative for dizziness, sensory change, loss of consciousness, weakness and headaches.  Endo/Heme/Allergies: Negative for environmental allergies. Does not bruise/bleed easily.  Psychiatric/Behavioral: Negative for depression and suicidal ideas. The patient is nervous/anxious. The patient does not have insomnia.        Objective:    Physical Exam Vitals signs and nursing note reviewed.  Constitutional:      General: He is not in acute distress.    Appearance: He is well-developed.  HENT:     Head: Normocephalic and atraumatic.     Right Ear: Tympanic membrane normal.     Left Ear: Tympanic membrane normal.     Nose: Nose normal. No congestion or rhinorrhea.     Mouth/Throat:     Mouth: Mucous membranes are  moist.  Eyes:     General:        Right eye: No discharge.        Left eye: No discharge.     Extraocular Movements: Extraocular movements intact.     Conjunctiva/sclera: Conjunctivae normal.     Pupils: Pupils are equal, round, and reactive to light.  Neck:     Musculoskeletal: Normal range of  motion and neck supple.  Cardiovascular:     Rate and Rhythm: Normal rate and regular rhythm.     Pulses: Normal pulses.     Heart sounds: Normal heart sounds. No murmur.  Pulmonary:     Effort: Pulmonary effort is normal.     Breath sounds: Normal breath sounds. No wheezing.  Abdominal:     General: Bowel sounds are normal. There is no distension.     Palpations: Abdomen is soft.     Tenderness: There is no abdominal tenderness. There is no guarding.  Musculoskeletal:        General: No tenderness.  Skin:    General: Skin is warm and dry.     Findings: No erythema.  Neurological:     Mental Status: He is alert. He is disoriented.  Psychiatric:        Mood and Affect: Mood normal.        Behavior: Behavior normal.        Thought Content: Thought content normal.     BP 140/68 (BP Location: Left Arm, Patient Position: Sitting, Cuff Size: Normal)   Pulse 77   Temp 97.9 F (36.6 C) (Oral)   Resp 18   Ht 5\' 9"  (1.753 m)   Wt 229 lb (103.9 kg)   SpO2 97%   BMI 33.82 kg/m  Wt Readings from Last 3 Encounters:  10/05/18 229 lb (103.9 kg)  12/30/16 224 lb (101.6 kg)  12/23/16 224 lb (101.6 kg)    Diabetic Foot Exam - Simple   No data filed     Lab Results  Component Value Date   WBC 8.7 10/05/2018   HGB 13.9 10/05/2018   HCT 40.4 10/05/2018   PLT 254.0 10/05/2018   GLUCOSE 133 (H) 10/05/2018   ALT 23 10/05/2018   AST 15 10/05/2018   NA 137 10/05/2018   K 4.1 10/05/2018   CL 104 10/05/2018   CREATININE 1.06 10/05/2018   BUN 14 10/05/2018   CO2 25 10/05/2018   TSH 0.97 10/05/2018   PSA 0.36 10/05/2018   HGBA1C 5.8 10/06/2018    Lab Results  Component Value Date    TSH 0.97 10/05/2018   Lab Results  Component Value Date   WBC 8.7 10/05/2018   HGB 13.9 10/05/2018   HCT 40.4 10/05/2018   MCV 92.9 10/05/2018   PLT 254.0 10/05/2018   Lab Results  Component Value Date   NA 137 10/05/2018   K 4.1 10/05/2018   CO2 25 10/05/2018   GLUCOSE 133 (H) 10/05/2018   BUN 14 10/05/2018   CREATININE 1.06 10/05/2018   BILITOT 0.6 10/05/2018   ALKPHOS 89 10/05/2018   AST 15 10/05/2018   ALT 23 10/05/2018   PROT 6.6 10/05/2018   ALBUMIN 4.2 10/05/2018   CALCIUM 9.0 10/05/2018   ANIONGAP 10 12/31/2016   GFR 70.90 10/05/2018   No results found for: CHOL No results found for: HDL No results found for: LDLCALC No results found for: TRIG No results found for: CHOLHDL Lab Results  Component Value Date   HGBA1C 5.8 10/06/2018       Assessment & Plan:   Problem List Items Addressed This Visit    Obese    Encouraged DASH diet, decrease po intake and increase exercise as tolerated. Needs 7-8 hours of sleep nightly. Avoid trans fats, eat small, frequent meals every 4-5 hours with lean proteins, complex carbs and healthy fats. Minimize simple carbs, GMO foods.      Arthritis  GERD (gastroesophageal reflux disease)   Relevant Medications   famotidine (PEPCID) 40 MG tablet   Other Relevant Orders   H. pylori antibody, IgG (Completed)   Hyperlipidemia    Encouraged heart healthy diet, increase exercise, avoid trans fats, consider a krill oil cap daily. Tolerating statins      Relevant Medications   metoprolol succinate (TOPROL-XL) 25 MG 24 hr tablet   atorvastatin (LIPITOR) 40 MG tablet   lisinopril (ZESTRIL) 5 MG tablet   Hypertension - Primary   Relevant Medications   metoprolol succinate (TOPROL-XL) 25 MG 24 hr tablet   atorvastatin (LIPITOR) 40 MG tablet   lisinopril (ZESTRIL) 5 MG tablet   Other Relevant Orders   CBC (Completed)   Comprehensive metabolic panel (Completed)   TSH (Completed)   Myocardial infarct (HCC)    Follows with  cardiology in [place] and doing well      Relevant Medications   metoprolol succinate (TOPROL-XL) 25 MG 24 hr tablet   atorvastatin (LIPITOR) 40 MG tablet   lisinopril (ZESTRIL) 5 MG tablet   History of nephrolithiasis   History of chicken pox    encouraged shingrix shot      H/O tobacco use, presenting hazards to health    He admits but his wife does not know that with all of the stress during the pandemic he has been sneaking an occasional cigarette due to anxiety. Encouraged to pursue complete abstinence.       Anxiety disorder    Patient notes increased anxiety since the onset of the pandemic. He is given a prescription for Alprazolam just a small amount to use prn instead of reaching for a cigarette. Reevaluate at next visit.       Relevant Medications   ALPRAZolam (XANAX) 0.25 MG tablet    Other Visit Diagnoses    Nocturia       Relevant Orders   PSA (Completed)   Abdominal pain, unspecified abdominal location       Relevant Orders   H. pylori antibody, IgG (Completed)   Colon cancer screening       Relevant Orders   Ambulatory referral to Gastroenterology      I have discontinued Theordore S. Closs's lisinopril, Omega-3 Fatty Acids (OMEGA 3 PO), docusate sodium, ferrous sulfate, polyethylene glycol, methocarbamol, and HYDROcodone-acetaminophen. I have also changed his metoprolol succinate, atorvastatin, and lisinopril. Additionally, I am having him start on ALPRAZolam and famotidine. Lastly, I am having him maintain his nitroGLYCERIN.  Meds ordered this encounter  Medications  . DISCONTD: lisinopril (ZESTRIL) 5 MG tablet    Sig: Take 1 tablet (5 mg total) by mouth every morning.    Dispense:  90 tablet    Refill:  1  . metoprolol succinate (TOPROL-XL) 25 MG 24 hr tablet    Sig: Take 1 tablet (25 mg total) by mouth every morning.    Dispense:  90 tablet    Refill:  1  . atorvastatin (LIPITOR) 40 MG tablet    Sig: Take 1 tablet (40 mg total) by mouth every morning.     Dispense:  90 tablet    Refill:  1  . ALPRAZolam (XANAX) 0.25 MG tablet    Sig: Take 0.5-1 tablets (0.125-0.25 mg total) by mouth 2 (two) times daily as needed for anxiety.    Dispense:  10 tablet    Refill:  0  . famotidine (PEPCID) 40 MG tablet    Sig: Take 1 tablet (40 mg total) by mouth daily as needed for  heartburn or indigestion.    Dispense:  90 tablet    Refill:  1  . lisinopril (ZESTRIL) 5 MG tablet    Sig: Take 1 tablet (5 mg total) by mouth 2 (two) times a day.    Dispense:  180 tablet    Refill:  1     Danise EdgeStacey Vasilia Dise, MD

## 2018-11-05 ENCOUNTER — Encounter: Payer: Self-pay | Admitting: Family Medicine

## 2018-11-23 ENCOUNTER — Other Ambulatory Visit: Payer: Self-pay | Admitting: Family Medicine

## 2018-11-24 NOTE — Telephone Encounter (Signed)
Requesting: xanax Contract:no UDS:no Last OV:10/05/18 Next OV:01/06/19 Last Refill:10/05/18  #[id number] Database:   Please advise

## 2019-01-06 ENCOUNTER — Ambulatory Visit: Payer: 59 | Admitting: Family Medicine

## 2019-01-06 ENCOUNTER — Other Ambulatory Visit: Payer: Self-pay

## 2019-01-10 ENCOUNTER — Telehealth: Payer: Self-pay | Admitting: Family Medicine

## 2019-01-10 NOTE — Telephone Encounter (Signed)
Pt did not answer/no show 01/06/2019. LM for pt to call and RS.

## 2019-03-29 ENCOUNTER — Other Ambulatory Visit: Payer: Self-pay | Admitting: Family Medicine

## 2019-05-28 ENCOUNTER — Other Ambulatory Visit: Payer: Self-pay | Admitting: Family Medicine

## 2019-07-13 ENCOUNTER — Other Ambulatory Visit: Payer: Self-pay | Admitting: Family Medicine

## 2019-10-04 ENCOUNTER — Other Ambulatory Visit: Payer: Self-pay | Admitting: Family Medicine

## 2019-10-19 ENCOUNTER — Other Ambulatory Visit: Payer: Self-pay | Admitting: Family Medicine

## 2020-01-04 ENCOUNTER — Ambulatory Visit (HOSPITAL_BASED_OUTPATIENT_CLINIC_OR_DEPARTMENT_OTHER)
Admission: RE | Admit: 2020-01-04 | Discharge: 2020-01-04 | Disposition: A | Discharge status: Home / Self Care | Payer: 59 | Source: Ambulatory Visit | Attending: Medical | Admitting: Medical

## 2020-01-04 ENCOUNTER — Other Ambulatory Visit: Payer: Self-pay

## 2020-01-04 ENCOUNTER — Ambulatory Visit: Payer: 59 | Admitting: Medical

## 2020-01-04 VITALS — BP 136/60 | HR 84 | Resp 18 | Ht 69.0 in | Wt 225.0 lb

## 2020-01-04 DIAGNOSIS — Z23 Encounter for immunization: Secondary | ICD-10-CM

## 2020-01-04 DIAGNOSIS — R0789 Other chest pain: Secondary | ICD-10-CM | POA: Diagnosis not present

## 2020-01-04 DIAGNOSIS — K219 Gastro-esophageal reflux disease without esophagitis: Secondary | ICD-10-CM | POA: Diagnosis not present

## 2020-01-04 DIAGNOSIS — R109 Unspecified abdominal pain: Secondary | ICD-10-CM

## 2020-01-04 DIAGNOSIS — R0781 Pleurodynia: Secondary | ICD-10-CM

## 2020-01-04 DIAGNOSIS — M549 Dorsalgia, unspecified: Secondary | ICD-10-CM | POA: Diagnosis not present

## 2020-01-04 DIAGNOSIS — R059 Cough, unspecified: Secondary | ICD-10-CM

## 2020-01-04 LAB — TROPONIN I: Troponin I: 3 ng/L (ref <=47)

## 2020-01-04 LAB — POC URINALSYSI DIPSTICK (AUTOMATED)
Bilirubin, UA: NEGATIVE
Blood, UA: NEGATIVE
Glucose, UA: NEGATIVE
Ketones, UA: NEGATIVE
Leukocytes, UA: NEGATIVE
Nitrite, UA: NEGATIVE
Protein, UA: POSITIVE — AB
Spec Grav, UA: 1.025 (ref 1.010–1.025)
Urobilinogen, UA: 1 E.U./dL
pH, UA: 5.5 (ref 5.0–8.0)

## 2020-01-04 NOTE — Patient Instructions (Addendum)
You have  direct pain on palpation of xiphoid bone.  Would recommend low-dose Tylenol 325  And I had low-dose ibuprofen 200 mg if needed.  For history of GERD recommend strict diet and continue Pepcid.  Since you do have abdomen region pain do want to get CBC, CMP and lipase.  For history of hypertension continue lisinopril.  You also reports some right posterior rib region pain along with some of her costo vertebral angle/kidney region pain.  We did a urinalysis in the office.  Also want you to get right rib series with chest x-ray.  We will see if he has any bone abnormality of ribs and might get some inflammation around xiphoid process.  History of MI and some abdomen region pain.  Pain does not sound cardiac.  But in light of history of MI and risk factors decided to get EKG. EKG showed sinus rhythm with rate of right bundle branch block.  Looks different in  March 2018 EKG in epic.  When I tried to find most recent EKGs in care everywhere no EKG found just description of EKGs done at his cardiologist.  For caution sake we will get 1 set of troponin.  Advised patient that if he gets any chest pain be seen in emergency department.  Differential diagnosis of abdomen/xiphoid region pain discussed.  Also differential diagnosis of right-sided lower back pain.  Possible just muscle pain.  Pain could change and indicate kidney stones.  Also would ask look for any rashes early shingles could be in differential.   concern for bronchitis.  We will follow the x-ray of the chest.  If cough worsen productively now and will give antibiotic.  Follow-up in 7 to 10 days or as needed.

## 2020-01-04 NOTE — Progress Notes (Signed)
Subjective:    Patient ID: Darryl Ramsey, male    DOB: October 27, 1955, 63 y.o.   MRN: [id number]  HPI  Pt in with some rt side back pain for one month. He pints to lower rt cva area/lower back area. Pain is on and off. Pain states pain every day. Pain can be present at rest and with movement. Pain does not radiate to abdomen. No groin pain. No rash on his skin. Pt states has tried ibuprofen and helps some if he used 800 mg will help some. Pt had kidney stones. He states history of kidney stones but this does not feel like a stone per pt report.  Pt states recent coughing a lot at night. He has history of bronchitis. He stopped smoking for about 3-4 years. Started back smoking 2 years. Cough is both dry and some productive  Pt reports some epigastric pain for 2 weeks. Pt does have hx of gerd. Palpating of xyphoid process increases the pain.  Review of Systems  Constitutional: Negative for chills and fatigue.  Respiratory: Positive for cough. Negative for chest tightness, shortness of breath and wheezing.   Cardiovascular: Negative for chest pain and palpitations.  Gastrointestinal: Positive for abdominal pain. Negative for diarrhea, nausea and vomiting.  Endocrine: Negative for polydipsia, polyphagia and polyuria.  Musculoskeletal: Positive for back pain.  Skin: Negative for rash.  Neurological: Negative for dizziness, seizures, speech difficulty, weakness and light-headedness.  Hematological: Negative for adenopathy. Does not bruise/bleed easily.  Psychiatric/Behavioral: Negative for behavioral problems, confusion and decreased concentration.    Past Medical History:  Diagnosis Date  . Arthritis   . Coronary artery disease   . GERD (gastroesophageal reflux disease)   . History of chicken pox 10/06/2018  . History of kidney stones   . Hyperlipidemia   . Hypertension   . Myocardial infarct (HCC) 2011  . Renal calculi   . Renal disorder    left ureteral stone     Social History     Socioeconomic History  . Marital status: Married    Spouse name: Not on file  . Number of children: Not on file  . Years of education: Not on file  . Highest education level: Not on file  Occupational History  . Not on file  Tobacco Use  . Smoking status: Former Smoker    Types: Cigarettes    Quit date: 2011    Years since quitting: 10.7  . Smokeless tobacco: Former Clinical biochemist  . Vaping Use: Never used  Substance and Sexual Activity  . Alcohol use: Yes    Comment: rare  . Drug use: No  . Sexual activity: Not on file  Other Topics Concern  . Not on file  Social History Narrative  . Not on file   Social Determinants of Health   Financial Resource Strain:   . Difficulty of Paying Living Expenses: Not on file  Food Insecurity:   . Worried About Programme researcher, broadcasting/film/video in the Last Year: Not on file  . Ran Out of Food in the Last Year: Not on file  Transportation Needs:   . Lack of Transportation (Medical): Not on file  . Lack of Transportation (Non-Medical): Not on file  Physical Activity:   . Days of Exercise per Week: Not on file  . Minutes of Exercise per Session: Not on file  Stress:   . Feeling of Stress : Not on file  Social Connections:   . Frequency of Communication with  Friends and Family: Not on file  . Frequency of Social Gatherings with Friends and Family: Not on file  . Attends Religious Services: Not on file  . Active Member of Clubs or Organizations: Not on file  . Attends Banker Meetings: Not on file  . Marital Status: Not on file  Intimate Partner Violence:   . Fear of Current or Ex-Partner: Not on file  . Emotionally Abused: Not on file  . Physically Abused: Not on file  . Sexually Abused: Not on file    Past Surgical History:  Procedure Laterality Date  . APPENDECTOMY    . BACK SURGERY  2007   lumbar, no hardware  . CORONARY ANGIOPLASTY     stents   . CORONARY STENT PLACEMENT  2011   patient says he had an MI and  coronary stent 2011. No coronary problems since  . CYSTOSCOPY WITH STENT PLACEMENT  2006 and in 80's  . TONSILLECTOMY     and adenoids  . TOTAL HIP ARTHROPLASTY Right 12/30/2016   Procedure: RIGHT TOTAL HIP ARTHROPLASTY ANTERIOR APPROACH;  Surgeon: Durene Romans, MD;  Location: [facility];  Service: Orthopedics;  Laterality: Right;  70 mins    Family History  Problem Relation Age of Onset  . Cancer Mother        breast with mets to brain  . Diabetes Father   . Hypertension Father   . Heart disease Father   . Hyperlipidemia Father   . Gallstones Brother   . Cancer Paternal Grandfather        lung    No Known Allergies  Current Outpatient Medications on File Prior to Visit  Medication Sig Dispense Refill  . ALPRAZolam (XANAX) 0.25 MG tablet TAKE 1/2 TO 1 (ONE-HALF TO ONE) TABLET BY MOUTH TWICE DAILY AS NEEDED FOR ANXIETY 10 tablet 0  . atorvastatin (LIPITOR) 40 MG tablet TAKE 1 TABLET BY MOUTH ONCE DAILY IN THE MORNING 90 tablet 0  . famotidine (PEPCID) 40 MG tablet Take 1 tablet (40 mg total) by mouth daily as needed for heartburn or indigestion. 90 tablet 0  . lisinopril (ZESTRIL) 5 MG tablet TAKE 1 TABLET BY MOUTH IN THE MORNING 90 tablet 0  . metoprolol succinate (TOPROL-XL) 25 MG 24 hr tablet Take 1 tablet (25 mg total) by mouth every morning. 90 tablet 1  . nitroGLYCERIN (NITROSTAT) 0.4 MG SL tablet Place 0.4 mg under the tongue every 5 (five) minutes as needed for chest pain.     No current facility-administered medications on file prior to visit.    BP 136/60   Pulse 84   Resp 18   Ht 5\' 9"  (1.753 m)   Wt 225 lb (102.1 kg)   SpO2 95%   BMI 33.23 kg/m      Objective:   Physical Exam  General Mental Status- Alert. General Appearance- Not in acute distress.   Skin General: Color- Normal Color. Moisture- Normal Moisture.  Neck Carotid Arteries- Normal color. Moisture- Normal Moisture. No carotid bruits. No JVD.  Chest and Lung Exam Auscultation: Breath  Sounds:-Normal.  Cardiovascular Auscultation:Rythm- Regular. Murmurs & Other Heart Sounds:Auscultation of the heart reveals- No Murmurs.  Abdomen Inspection:-Inspeection Normal. Palpation/Percussion:Note:No mass. Palpation and Percussion of the abdomen reveal-faint epigastric tender, Non Distended + BS, no rebound or guarding.  Anterior thorax-direct pain on palpation of xiphoid process.  No pain on palpation of left side costochondral junction.   Neurologic Cranial Nerve exam:- CN III-XII intact(No nystagmus), symmetric smile. Strength:- 5/5  equal and symmetric strength both upper and lower extremities.  Back-patient has mild tenderness palpation of the right posterior lower rib and upper right CVA region.  No redness, no warmth on palpation.  No vesicles seen.     Assessment & Plan:  You have  direct pain on palpation of xiphoid bone.  Would recommend low-dose Tylenol 325  And I had low-dose ibuprofen 200 mg if needed.  For history of GERD recommend strict diet and continue Pepcid.  Since you do have abdomen region pain do want to get CBC, CMP and lipase.  For history of hypertension continue lisinopril.  You also reports some right posterior rib region pain along with some of her costo vertebral angle/kidney region pain.  We did a urinalysis in the office.  Also want you to get right rib series with chest x-ray.  We will see if he has any bone abnormality of ribs and might get some inflammation around xiphoid process.   History of MI and some abdomen region pain.  Pain does not sound cardiac.  But in light of history of MI and risk factors decided to get EKG. EKG showed sinus rhythm with rate of right bundle branch block.  Looks different in  March 2018 EKG in epic.  When I tried to find most recent EKGs in care everywhere no EKG found just description of EKGs done at his cardiologist.  For caution sake we will get 1 set of troponin.  Advised patient that if he gets any chest pain be  seen in emergency department.  Differential diagnosis of abdomen/xiphoid region pain discussed.  Also differential diagnosis of right-sided lower back pain.  Possible just muscle pain.  Pain could change and indicate kidney stones.  Also would ask look for any rashes early shingles could be in differential.  Follow-up in 7 to 10 days or as needed.    Esperanza Richters, PA-C   Time spent with patient today was 40  minutes which consisted of chart review, discussing differential diagnoses, work up,  treatment and documentation.

## 2020-01-05 ENCOUNTER — Telehealth: Payer: Self-pay | Admitting: Medical

## 2020-01-05 LAB — LIPASE: Lipase: 221 U/L — ABNORMAL HIGH (ref 7–60)

## 2020-01-05 LAB — CBC WITH DIFFERENTIAL/PLATELET
Absolute Monocytes: 1297 cells/uL — ABNORMAL HIGH (ref 200–950)
Basophils Absolute: 167 cells/uL (ref 0–200)
Basophils Relative: 1.4 %
Eosinophils Absolute: 536 cells/uL — ABNORMAL HIGH (ref 15–500)
Eosinophils Relative: 4.5 %
HCT: 42.2 % (ref 38.5–50.0)
Hemoglobin: 14.4 g/dL (ref 13.2–17.1)
Lymphs Abs: 2678 cells/uL (ref 850–3900)
MCH: 31.4 pg (ref 27.0–33.0)
MCHC: 34.1 g/dL (ref 32.0–36.0)
MCV: 91.9 fL (ref 80.0–100.0)
MPV: 10.6 fL (ref 7.5–12.5)
Monocytes Relative: 10.9 %
Neutro Abs: 7223 cells/uL (ref 1500–7800)
Neutrophils Relative %: 60.7 %
Platelets: 277 10*3/uL (ref 140–400)
RBC: 4.59 10*6/uL (ref 4.20–5.80)
RDW: 12.8 % (ref 11.0–15.0)
Total Lymphocyte: 22.5 %
WBC: 11.9 10*3/uL — ABNORMAL HIGH (ref 3.8–10.8)

## 2020-01-05 LAB — COMPREHENSIVE METABOLIC PANEL
AG Ratio: 1.8 (calc) (ref 1.0–2.5)
ALT: 22 U/L (ref 9–46)
AST: 15 U/L (ref 10–35)
Albumin: 4.1 g/dL (ref 3.6–5.1)
Alkaline phosphatase (APISO): 84 U/L (ref 35–144)
BUN: 14 mg/dL (ref 7–25)
CO2: 26 mmol/L (ref 20–32)
Calcium: 9.4 mg/dL (ref 8.6–10.3)
Chloride: 101 mmol/L (ref 98–110)
Creat: 1.22 mg/dL (ref 0.70–1.25)
Globulin: 2.3 g/dL (calc) (ref 1.9–3.7)
Glucose, Bld: 111 mg/dL — ABNORMAL HIGH (ref 65–99)
Potassium: 4.2 mmol/L (ref 3.5–5.3)
Sodium: 137 mmol/L (ref 135–146)
Total Bilirubin: 0.7 mg/dL (ref 0.2–1.2)
Total Protein: 6.4 g/dL (ref 6.1–8.1)

## 2020-01-05 MED ORDER — AZITHROMYCIN 250 MG PO TABS: ORAL_TABLET | ORAL | 0 refills | Status: DC

## 2020-01-05 NOTE — Telephone Encounter (Signed)
Rx azithromycin sent to pt pharmacy. 

## 2020-01-12 ENCOUNTER — Other Ambulatory Visit: Payer: Self-pay

## 2020-01-12 ENCOUNTER — Telehealth: Payer: Self-pay | Admitting: Medical

## 2020-01-12 ENCOUNTER — Ambulatory Visit: Payer: 59 | Admitting: Medical

## 2020-01-12 VITALS — BP 121/68 | HR 68 | Temp 98.1°F | Resp 18 | Ht 69.0 in | Wt 220.0 lb

## 2020-01-12 DIAGNOSIS — R0789 Other chest pain: Secondary | ICD-10-CM | POA: Diagnosis not present

## 2020-01-12 DIAGNOSIS — K219 Gastro-esophageal reflux disease without esophagitis: Secondary | ICD-10-CM

## 2020-01-12 DIAGNOSIS — F17200 Nicotine dependence, unspecified, uncomplicated: Secondary | ICD-10-CM

## 2020-01-12 DIAGNOSIS — R0781 Pleurodynia: Secondary | ICD-10-CM | POA: Diagnosis not present

## 2020-01-12 DIAGNOSIS — D72829 Elevated white blood cell count, unspecified: Secondary | ICD-10-CM

## 2020-01-12 DIAGNOSIS — K861 Other chronic pancreatitis: Secondary | ICD-10-CM

## 2020-01-12 DIAGNOSIS — F419 Anxiety disorder, unspecified: Secondary | ICD-10-CM

## 2020-01-12 LAB — CBC WITH DIFFERENTIAL/PLATELET
Absolute Monocytes: 949 cells/uL (ref 200–950)
Basophils Absolute: 141 cells/uL (ref 0–200)
Basophils Relative: 1.5 %
Eosinophils Absolute: 301 cells/uL (ref 15–500)
Eosinophils Relative: 3.2 %
HCT: 42.9 % (ref 38.5–50.0)
Hemoglobin: 14.7 g/dL (ref 13.2–17.1)
Lymphs Abs: 2209 cells/uL (ref 850–3900)
MCH: 31.9 pg (ref 27.0–33.0)
MCHC: 34.3 g/dL (ref 32.0–36.0)
MCV: 93.1 fL (ref 80.0–100.0)
MPV: 10.3 fL (ref 7.5–12.5)
Monocytes Relative: 10.1 %
Neutro Abs: 5800 cells/uL (ref 1500–7800)
Neutrophils Relative %: 61.7 %
Platelets: 283 10*3/uL (ref 140–400)
RBC: 4.61 10*6/uL (ref 4.20–5.80)
RDW: 13.1 % (ref 11.0–15.0)
Total Lymphocyte: 23.5 %
WBC: 9.4 10*3/uL (ref 3.8–10.8)

## 2020-01-12 LAB — COMPREHENSIVE METABOLIC PANEL
AG Ratio: 1.6 (calc) (ref 1.0–2.5)
ALT: 21 U/L (ref 9–46)
AST: 18 U/L (ref 10–35)
Albumin: 4.2 g/dL (ref 3.6–5.1)
Alkaline phosphatase (APISO): 81 U/L (ref 35–144)
BUN: 15 mg/dL (ref 7–25)
CO2: 26 mmol/L (ref 20–32)
Calcium: 9.7 mg/dL (ref 8.6–10.3)
Chloride: 102 mmol/L (ref 98–110)
Creat: 1.17 mg/dL (ref 0.70–1.25)
Globulin: 2.6 g/dL (calc) (ref 1.9–3.7)
Glucose, Bld: 102 mg/dL — ABNORMAL HIGH (ref 65–99)
Potassium: 4.6 mmol/L (ref 3.5–5.3)
Sodium: 137 mmol/L (ref 135–146)
Total Bilirubin: 0.7 mg/dL (ref 0.2–1.2)
Total Protein: 6.8 g/dL (ref 6.1–8.1)

## 2020-01-12 LAB — LIPASE: Lipase: 58 U/L (ref 7–60)

## 2020-01-12 MED ORDER — ALPRAZOLAM 0.25 MG PO TABS: ORAL_TABLET | ORAL | 0 refills | Status: DC

## 2020-01-12 NOTE — Telephone Encounter (Signed)
Placed ct abd/pelvis. Can you try to get priot auth

## 2020-01-12 NOTE — Patient Instructions (Addendum)
You appear to have pancreatitis as symptoms resolved with avoiding  fatty/fried foods and stopping alcohol use. Your lipase came down as well. After lab review today. Did go ahead and place order for ct abd/pelvis today. Continue changes in diet.   Hx of gerd and symptoms controlled.  Bp controlled. Continue lisinopril.  Hx of smoking. Put in screening ct chest/kung CA screening.  Recent pain over xyphoid area and back/rib area pain appears to have been related to pancreatitis as pain resolved with diet changes.  For anxiety rx low dose xanax. Hopefuly with less anxiety will smoke less. Controlled med rules discussed.  For elevated wbc repeat cbc.  Also repeat lipase and cmp today.  Follow up in 7-10 days or as needed

## 2020-01-12 NOTE — Progress Notes (Addendum)
Subjective:    Patient ID: Darryl Ramsey, male    DOB: 02-Jul-1955, 64 y.o.   MRN: [id number]  HPI  Pt in for follow up.   Last visit A/P "You have  direct pain on palpation of xiphoid bone.  Would recommend low-dose Tylenol 325  And I had low-dose ibuprofen 200 mg if needed.  For history of GERD recommend strict diet and continue Pepcid.  Since you do have abdomen region pain do want to get CBC, CMP and lipase.  For history of hypertension continue lisinopril.  You also reports some right posterior rib region pain along with some of her costo vertebral angle/kidney region pain.  We did a urinalysis in the office.  Also want you to get right rib series with chest x-ray.  We will see if he has any bone abnormality of ribs and might get some inflammation around xiphoid process.  Marland Kitchen History of MI and some abdomen region pain.  Pain does not sound cardiac.  But in light of history of MI and risk factors decided to get EKG. EKG showed sinus rhythm with rate of right bundle branch block.  Looks different in  March 2018 EKG in epic.  When I tried to find most recent EKGs in care everywhere no EKG found just description of EKGs done at his cardiologist.  For caution sake we will get 1 set of troponin.  Advised patient that if he gets any chest pain be seen in emergency department.  Differential diagnosis of abdomen/xiphoid region pain discussed.  Also differential diagnosis of right-sided lower back pain.  Possible just muscle pain.  Pain could change and indicate kidney stones.  Also would ask look for any rashes early shingles could be in differential."   Pt on review does not note any recent rt side rib injury in past 4-8 weeks. Though rt side rib area pain has been for about 4 weeks.  Pt lipase was elevated. I did advise him and he has not eaten any fried foods or greasy foods. No alcohol he notes pain around xyphid/upper stomach and rib/back area pain is less. Almost resolved  completely  Pt states his gerd is also better.  Pt does have history of smoking. Also he states anxiety. He notes when on meds in past for anxiety he smoked much less. Was almost able to quit.  Bp is controlled today.   Review of Systems  Constitutional: Negative for chills, fatigue and fever.  HENT: Negative for dental problem.   Respiratory: Negative for cough, chest tightness and shortness of breath.   Cardiovascular: Negative for chest pain and palpitations.  Gastrointestinal: Positive for abdominal pain.       Much less/basically resolved.  Genitourinary: Negative for dysuria.  Musculoskeletal: Negative for back pain and myalgias.       Rib region pain mostly resolved.  Skin: Negative for rash.  Neurological: Negative for dizziness, syncope, weakness, numbness and headaches.  Hematological: Negative for adenopathy.  Psychiatric/Behavioral: Negative for behavioral problems, confusion, decreased concentration and suicidal ideas. The patient is nervous/anxious.     Past Medical History:  Diagnosis Date  . Arthritis   . Coronary artery disease   . GERD (gastroesophageal reflux disease)   . History of chicken pox 10/06/2018  . History of kidney stones   . Hyperlipidemia   . Hypertension   . Myocardial infarct (HCC) 2011  . Renal calculi   . Renal disorder    left ureteral stone     Social  History   Socioeconomic History  . Marital status: Married    Spouse name: Not on file  . Number of children: Not on file  . Years of education: Not on file  . Highest education level: Not on file  Occupational History  . Not on file  Tobacco Use  . Smoking status: Former Smoker    Types: Cigarettes    Quit date: 2011    Years since quitting: 10.8  . Smokeless tobacco: Former Clinical biochemist  . Vaping Use: Never used  Substance and Sexual Activity  . Alcohol use: Yes    Comment: rare  . Drug use: No  . Sexual activity: Not on file  Other Topics Concern  . Not on file   Social History Narrative  . Not on file   Social Determinants of Health   Financial Resource Strain:   . Difficulty of Paying Living Expenses: Not on file  Food Insecurity:   . Worried About Programme researcher, broadcasting/film/video in the Last Year: Not on file  . Ran Out of Food in the Last Year: Not on file  Transportation Needs:   . Lack of Transportation (Medical): Not on file  . Lack of Transportation (Non-Medical): Not on file  Physical Activity:   . Days of Exercise per Week: Not on file  . Minutes of Exercise per Session: Not on file  Stress:   . Feeling of Stress : Not on file  Social Connections:   . Frequency of Communication with Friends and Family: Not on file  . Frequency of Social Gatherings with Friends and Family: Not on file  . Attends Religious Services: Not on file  . Active Member of Clubs or Organizations: Not on file  . Attends Banker Meetings: Not on file  . Marital Status: Not on file  Intimate Partner Violence:   . Fear of Current or Ex-Partner: Not on file  . Emotionally Abused: Not on file  . Physically Abused: Not on file  . Sexually Abused: Not on file    Past Surgical History:  Procedure Laterality Date  . APPENDECTOMY    . BACK SURGERY  2007   lumbar, no hardware  . CORONARY ANGIOPLASTY     stents   . CORONARY STENT PLACEMENT  2011   patient says he had an MI and coronary stent 2011. No coronary problems since  . CYSTOSCOPY WITH STENT PLACEMENT  2006 and in 80's  . TONSILLECTOMY     and adenoids  . TOTAL HIP ARTHROPLASTY Right 12/30/2016   Procedure: RIGHT TOTAL HIP ARTHROPLASTY ANTERIOR APPROACH;  Surgeon: Durene Romans, MD;  Location: [facility];  Service: Orthopedics;  Laterality: Right;  70 mins    Family History  Problem Relation Age of Onset  . Cancer Mother        breast with mets to brain  . Diabetes Father   . Hypertension Father   . Heart disease Father   . Hyperlipidemia Father   . Gallstones Brother   . Cancer Paternal  Grandfather        lung    No Known Allergies  Current Outpatient Medications on File Prior to Visit  Medication Sig Dispense Refill  . atorvastatin (LIPITOR) 40 MG tablet TAKE 1 TABLET BY MOUTH ONCE DAILY IN THE MORNING 90 tablet 0  . famotidine (PEPCID) 40 MG tablet Take 1 tablet (40 mg total) by mouth daily as needed for heartburn or indigestion. 90 tablet 0  . lisinopril (ZESTRIL)  5 MG tablet TAKE 1 TABLET BY MOUTH IN THE MORNING 90 tablet 0  . metoprolol succinate (TOPROL-XL) 25 MG 24 hr tablet Take 1 tablet (25 mg total) by mouth every morning. 90 tablet 1  . nitroGLYCERIN (NITROSTAT) 0.4 MG SL tablet Place 0.4 mg under the tongue every 5 (five) minutes as needed for chest pain.    Marland Kitchen azithromycin (ZITHROMAX) 250 MG tablet Take 2 tablets by mouth on day 1, followed by 1 tablet by mouth daily for 4 days. (Patient not taking: Reported on 01/12/2020) 6 tablet 0   No current facility-administered medications on file prior to visit.    BP 121/68   Pulse 68   Temp 98.1 F (36.7 C) (Oral)   Resp 18   Ht 5\' 9"  (1.753 m)   Wt 220 lb (99.8 kg)   SpO2 97%   BMI 32.49 kg/m       Objective:   Physical Exam  General Mental Status- Alert. General Appearance- Not in acute distress.   Skin General: Color- Normal Color. Moisture- Normal Moisture.  Neck Carotid Arteries- Normal color. Moisture- Normal Moisture. No carotid bruits. No JVD.  Chest and Lung Exam Auscultation: Breath Sounds:-Normal.  Cardiovascular Auscultation:Rythm- Regular. Murmurs & Other Heart Sounds:Auscultation of the heart reveals- No Murmurs.  Abdomen Inspection:-Inspeection Normal. Palpation/Percussion:Note:No mass. Palpation and Percussion of the abdomen reveal- Non Tender, Non Distended + BS, no rebound or guarding. No pain over xyphoid  Back- no pain on palpatin of back/over lower ribs.    Neurologic Cranial Nerve exam:- CN III-XII intact(No nystagmus), symmetric smile. .Strength:- 5/5 equal and  symmetric strength both upper and lower extremities.      Assessment & Plan:  You appear to have pancreatitis as symptoms resolved with avoiding  fatty/fried foods and stopping alcohol use. Your lipase came down as well. After lab review today. Did go ahead and place order for ct abd/pelvis today. Continue changes in diet.   Hx of gerd and symptoms controlled.  Bp controlled. Continue lisinopril.  Hx of smoking. Put in screening ct chest/kung CA screening.  Recent pain over xyphoid area and back/rib area pain appears to have been related to pancreatitis as pain resolved with diet changes.  For anxiety rx low dose xanax. Hopefuly with less anxiety will smoke less. Controlled med rules discussed.  For elevated wbc repeat cbc.  Also repeat lipase and cmp today.  Follow up in 7-10 days or as needed  Time spent with patient today was 40+  minutes which consisted of chart review, discussing diagnosis, work up, treatment and documentation.

## 2020-01-13 ENCOUNTER — Other Ambulatory Visit: Payer: Self-pay | Admitting: Family Medicine

## 2020-01-18 ENCOUNTER — Telehealth: Payer: Self-pay | Admitting: Medical

## 2020-01-18 ENCOUNTER — Ambulatory Visit (HOSPITAL_BASED_OUTPATIENT_CLINIC_OR_DEPARTMENT_OTHER)
Admission: RE | Admit: 2020-01-18 | Discharge: 2020-01-18 | Disposition: A | Discharge status: Home / Self Care | Payer: 59 | Source: Ambulatory Visit | Attending: Medical | Admitting: Medical

## 2020-01-18 ENCOUNTER — Other Ambulatory Visit: Payer: Self-pay

## 2020-01-18 DIAGNOSIS — F17200 Nicotine dependence, unspecified, uncomplicated: Secondary | ICD-10-CM | POA: Insufficient documentation

## 2020-01-18 NOTE — Telephone Encounter (Signed)
Opened to review 

## 2020-01-24 ENCOUNTER — Ambulatory Visit (HOSPITAL_BASED_OUTPATIENT_CLINIC_OR_DEPARTMENT_OTHER)
Admission: RE | Admit: 2020-01-24 | Discharge: 2020-01-24 | Disposition: A | Discharge status: Home / Self Care | Payer: 59 | Source: Ambulatory Visit | Attending: Medical | Admitting: Medical

## 2020-01-24 ENCOUNTER — Other Ambulatory Visit: Payer: Self-pay

## 2020-01-24 ENCOUNTER — Encounter (HOSPITAL_BASED_OUTPATIENT_CLINIC_OR_DEPARTMENT_OTHER): Payer: Self-pay

## 2020-01-24 DIAGNOSIS — K861 Other chronic pancreatitis: Secondary | ICD-10-CM

## 2020-01-24 MED ORDER — IOHEXOL 300 MG/ML  SOLN: 100.0000 mL | Freq: Once | INTRAMUSCULAR | Status: DC | PRN

## 2020-02-01 ENCOUNTER — Ambulatory Visit (HOSPITAL_BASED_OUTPATIENT_CLINIC_OR_DEPARTMENT_OTHER)
Admission: RE | Admit: 2020-02-01 | Discharge: 2020-02-01 | Disposition: A | Discharge status: Home / Self Care | Payer: 59 | Source: Ambulatory Visit | Attending: Medical | Admitting: Medical

## 2020-02-01 ENCOUNTER — Other Ambulatory Visit: Payer: Self-pay

## 2020-02-01 ENCOUNTER — Other Ambulatory Visit: Payer: Self-pay | Admitting: Family Medicine

## 2020-02-01 ENCOUNTER — Encounter (HOSPITAL_BASED_OUTPATIENT_CLINIC_OR_DEPARTMENT_OTHER): Payer: Self-pay

## 2020-02-01 DIAGNOSIS — K861 Other chronic pancreatitis: Secondary | ICD-10-CM | POA: Diagnosis present

## 2020-02-01 MED ORDER — IOHEXOL 300 MG/ML  SOLN
100.0000 mL | Freq: Once | INTRAMUSCULAR | Status: AC | PRN
  Administered 2020-02-01: 100 mL via INTRAVENOUS

## 2020-02-02 ENCOUNTER — Telehealth: Payer: Self-pay | Admitting: Medical

## 2020-02-02 DIAGNOSIS — N200 Calculus of kidney: Secondary | ICD-10-CM

## 2020-02-02 NOTE — Telephone Encounter (Signed)
Referral to urologist placed. 

## 2020-02-02 NOTE — Telephone Encounter (Signed)
On ct abd pancrease  normal. 5 mm kidney stone left side kidney. Will refer to urologist to get opinion. should be ok as long as stays in kidney. Sometimes can effect urine flow. If gets in kidney may pass. Most of time 5 mm stone will pass but can have pain. Some diverticulosis seen in sigmoid colon. Atherosclerosis in aorta. Continue atorvastatin. Will refer to urologist to evaluate kidney stone.

## 2020-02-02 NOTE — Telephone Encounter (Signed)
Called pt and lvm to return call.  

## 2020-02-06 ENCOUNTER — Encounter: Payer: Self-pay | Admitting: Medical

## 2020-02-06 NOTE — Telephone Encounter (Signed)
Pt called and labs were reviewed 

## 2020-02-07 ENCOUNTER — Other Ambulatory Visit: Payer: Self-pay | Admitting: Family Medicine

## 2020-04-23 ENCOUNTER — Other Ambulatory Visit: Payer: Self-pay | Admitting: Medical

## 2020-04-23 NOTE — Telephone Encounter (Signed)
Requesting: alprazolam 0.25mg  Contract: None UDS: None Last Visit: 01/04/2020 w/ Ramon Dredge  Next Visit: None Last Refill: 01/12/2020 #30 and 0RF  Please Advise

## 2020-05-04 ENCOUNTER — Other Ambulatory Visit: Payer: Self-pay | Admitting: Family Medicine

## 2020-08-13 ENCOUNTER — Other Ambulatory Visit: Payer: Self-pay | Admitting: Family Medicine

## 2020-08-14 ENCOUNTER — Telehealth: Payer: Self-pay

## 2020-08-14 NOTE — Telephone Encounter (Signed)
Called pt to get set up for an appointment so he continue getting medications.

## 2020-08-14 NOTE — Telephone Encounter (Signed)
Called to get he an appointment

## 2020-08-17 ENCOUNTER — Ambulatory Visit: Payer: 59 | Admitting: Medical

## 2021-02-09 IMAGING — DX DG RIBS W/ CHEST 3+V*R*
3 series · 3 of 3 positions shown · non-contrast
Comparison: Chest radiograph dated 07/29/2005.

CLINICAL DATA: 63-year-old male with pain over the xiphoid process
and right lower rib.

EXAM:
RIGHT RIBS AND CHEST - 3+ VIEW

[chest pa]
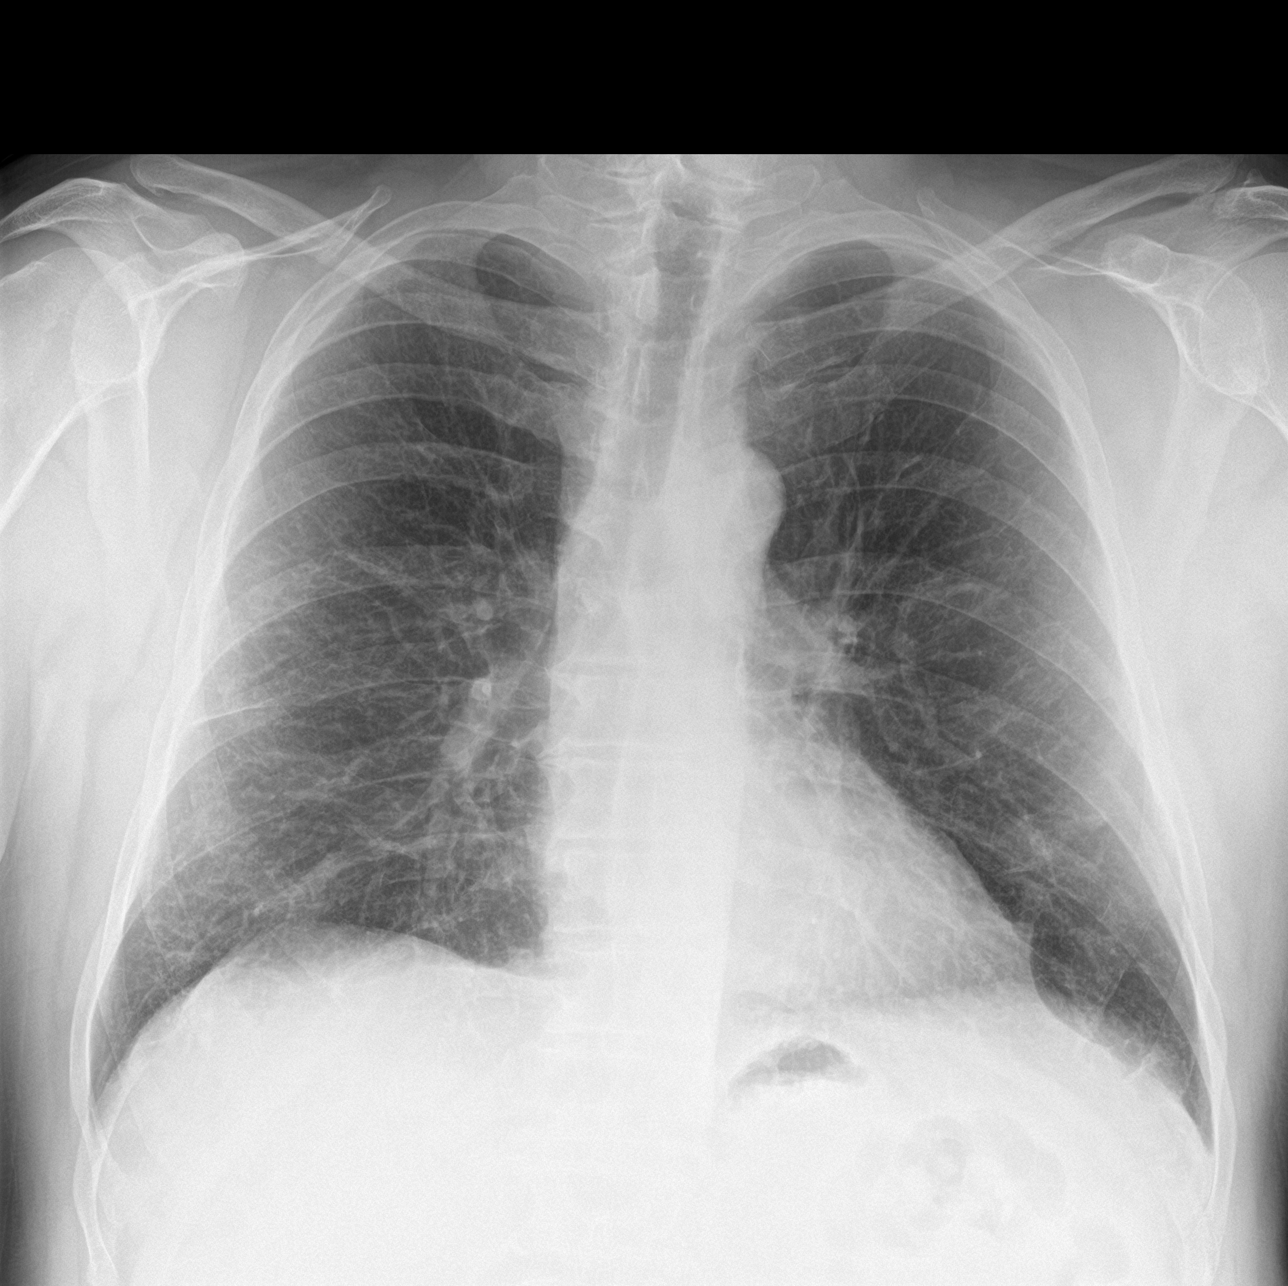

[rib ap]
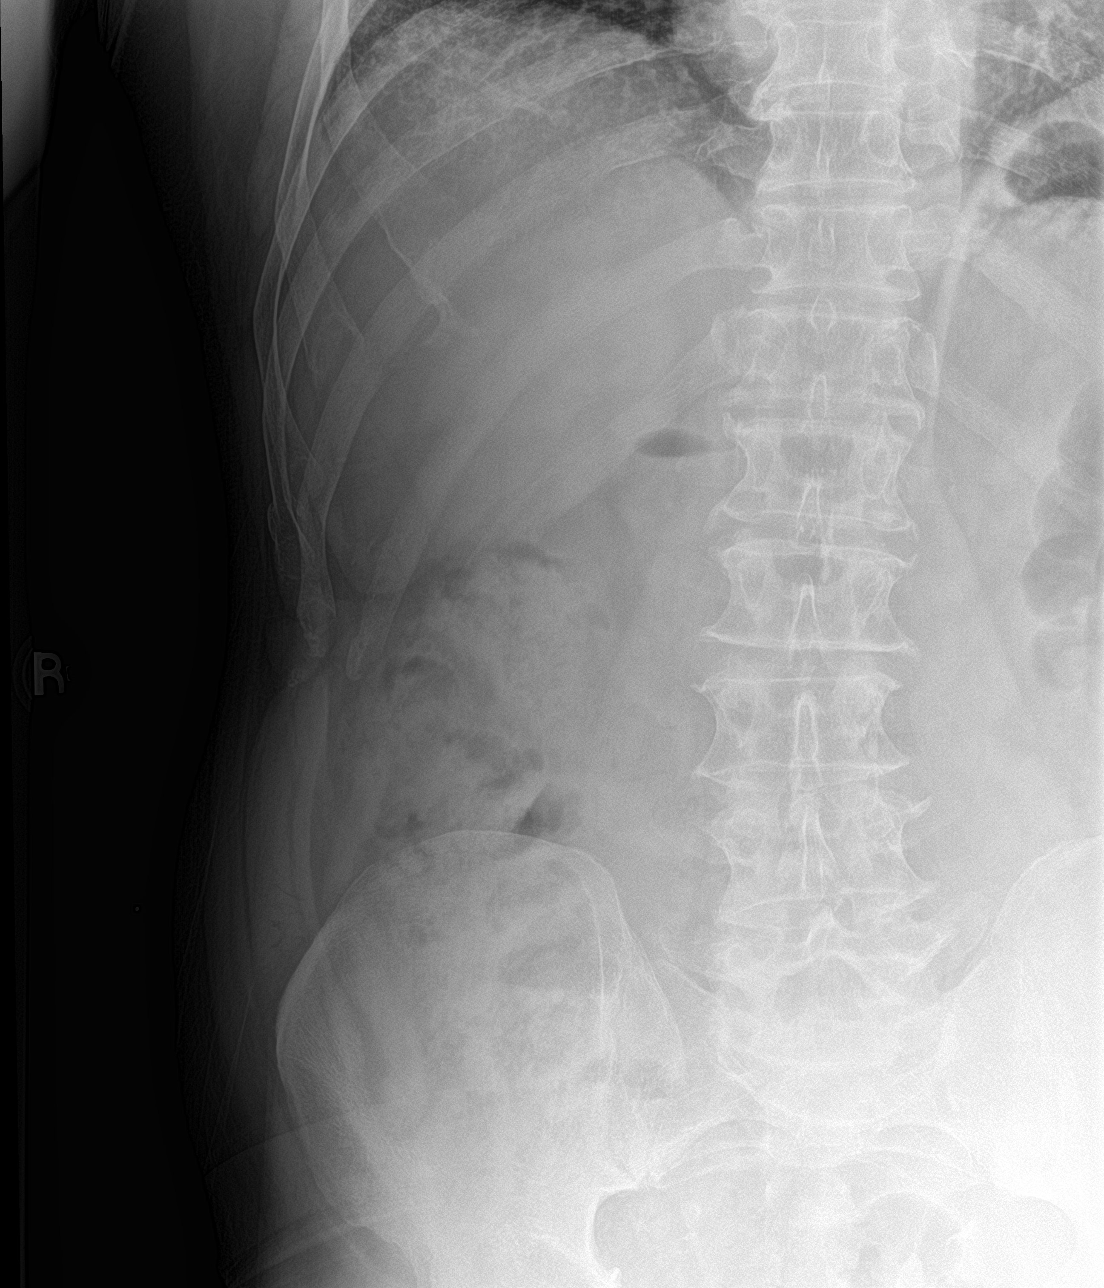

[rib ap obl]
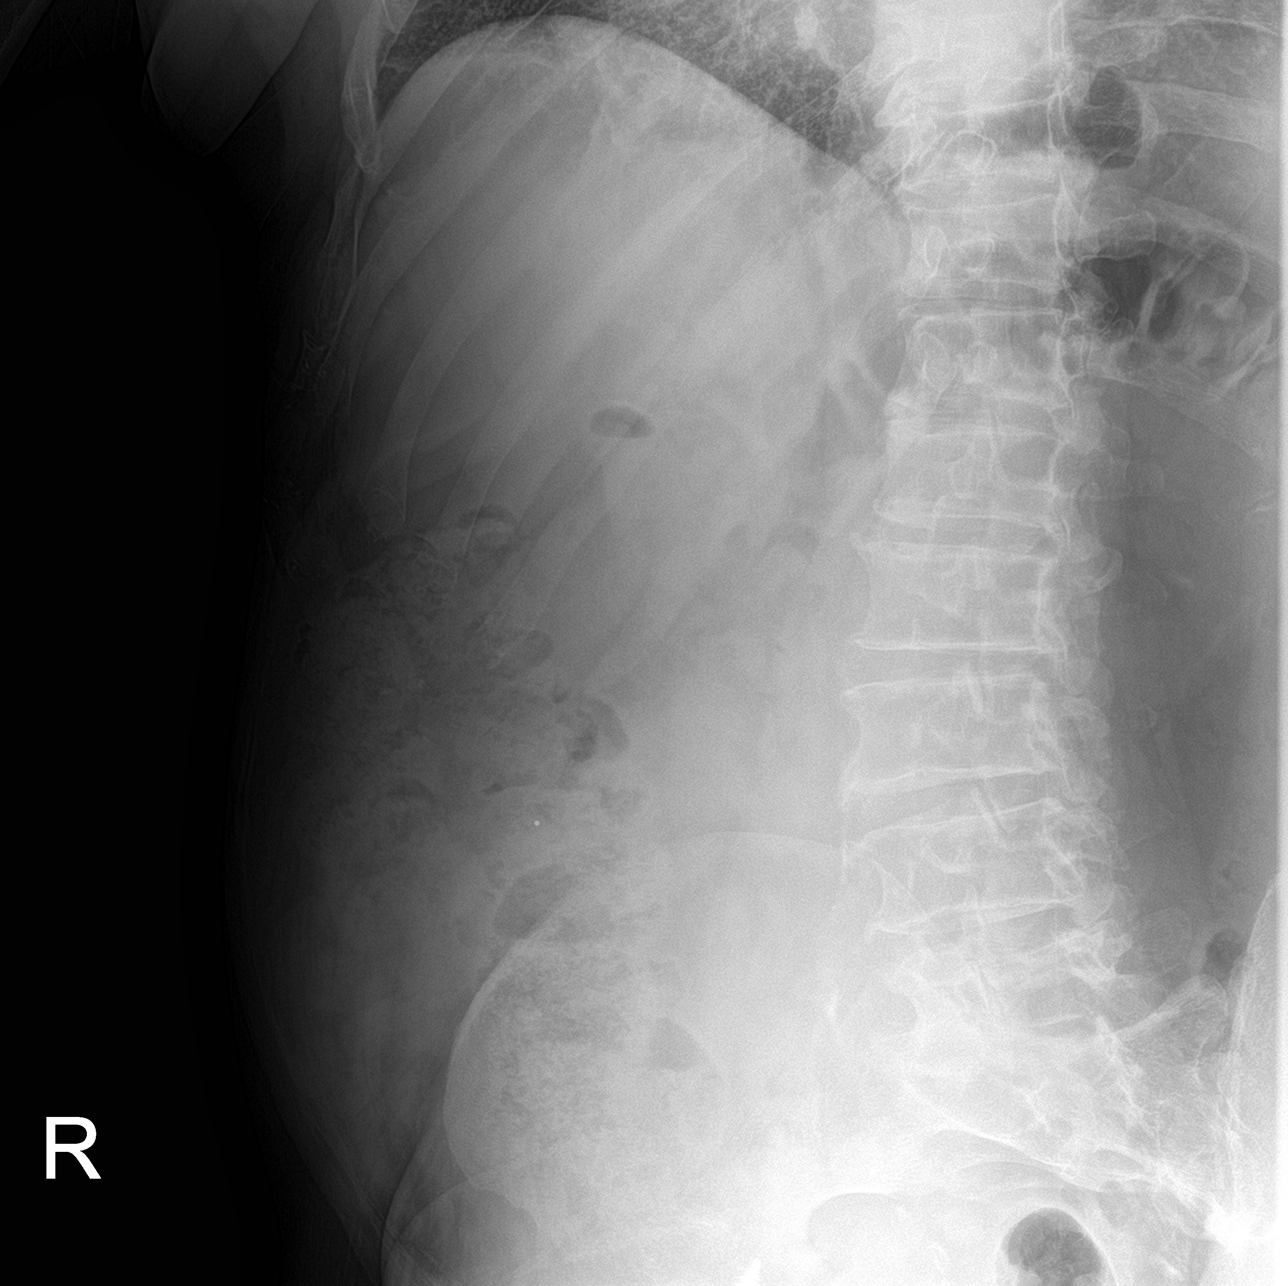

[3 of 3 positions shown; findings below may reference images not displayed]

FINDINGS: No focal consolidation, pleural effusion, or pneumothorax. The
cardiac silhouette is within limits. Age indeterminate, likely old
fracture of the lateral aspect of the right ninth rib. No other
acute osseous pathology.
IMPRESSION: Age indeterminate, likely old, fracture of the lateral right ninth
rib.

## 2021-03-08 ENCOUNTER — Other Ambulatory Visit: Payer: Self-pay | Admitting: Family Medicine

## 2021-03-09 IMAGING — CT CT ABD-PELV W/ CM
2 of 5 series · 16 of 46 positions shown, 18 images · IV contrast (Omnipaque)
Comparison: CT abdomen pelvis dated 07/17/2014.

CLINICAL DATA: 61-year-old male with elevated lipase. History of
pancreatitis.

EXAM:
CT ABDOMEN AND PELVIS WITH CONTRAST
TECHNIQUE: Multidetector CT imaging of the abdomen and pelvis was performed
using the standard protocol following bolus administration of
intravenous contrast.
CONTRAST:  100mL OMNIPAQUE IOHEXOL 300 MG/ML  SOLN

[Series 2: axial st · axial · 0.95mm/px · z∈[-572,-56]mm · 13 of 115 slices shown, 15 images]
[im 6/115  soft-tissue]
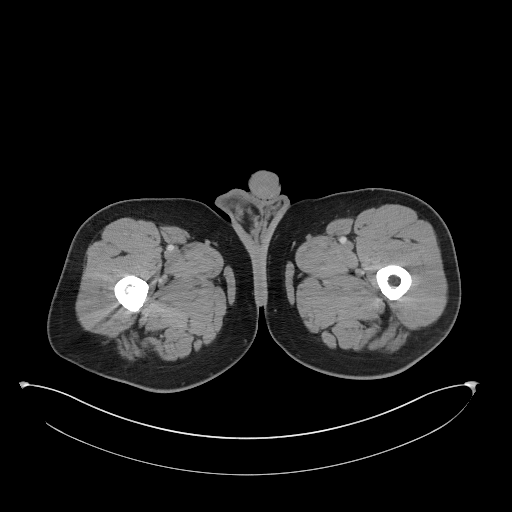
[im 6/115  bone]
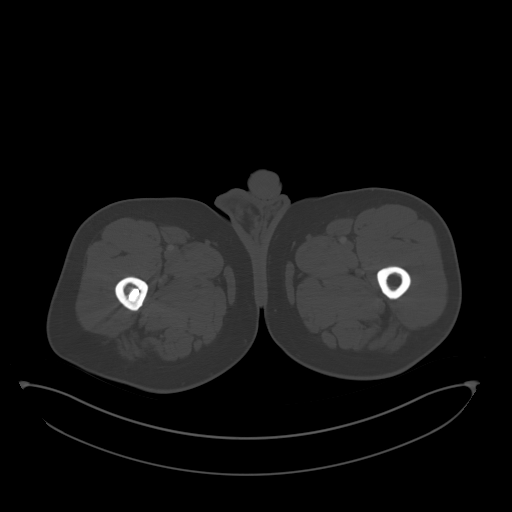
[im 18/115  soft-tissue]
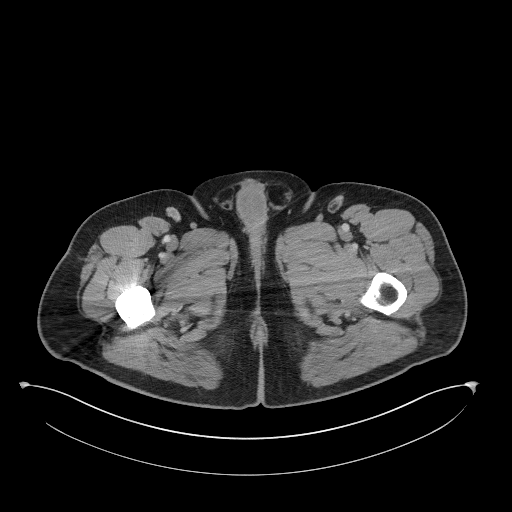
[im 23/115  soft-tissue]
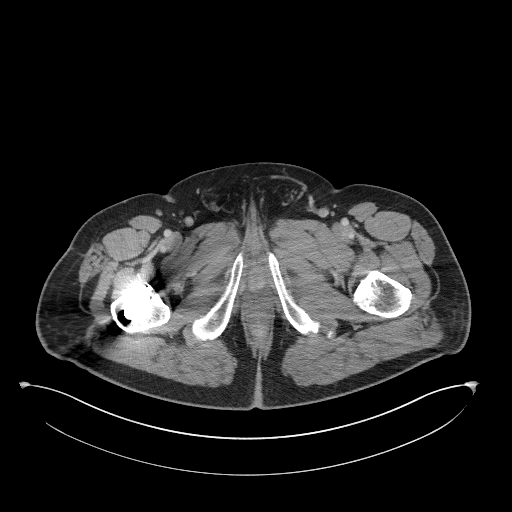
[im 35/115  soft-tissue]
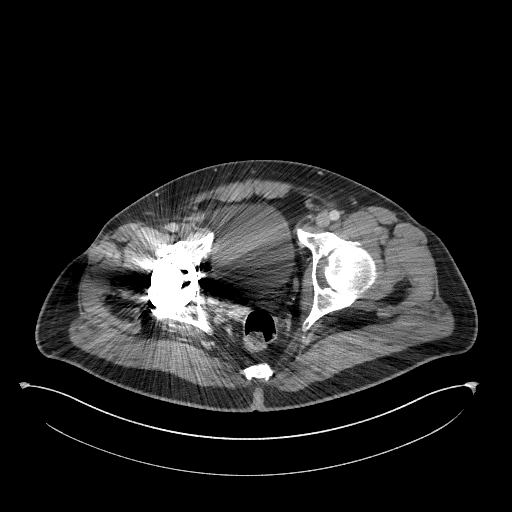
[im 40/115  soft-tissue]
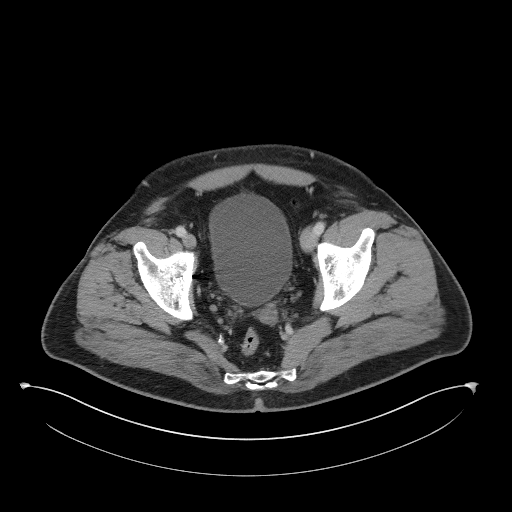
[im 52/115  soft-tissue]
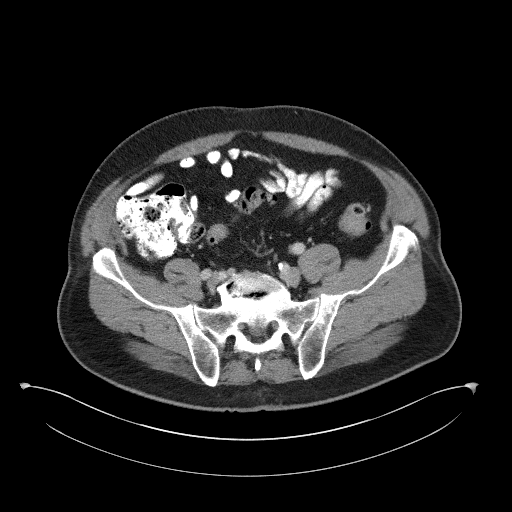
[im 58/115  soft-tissue]
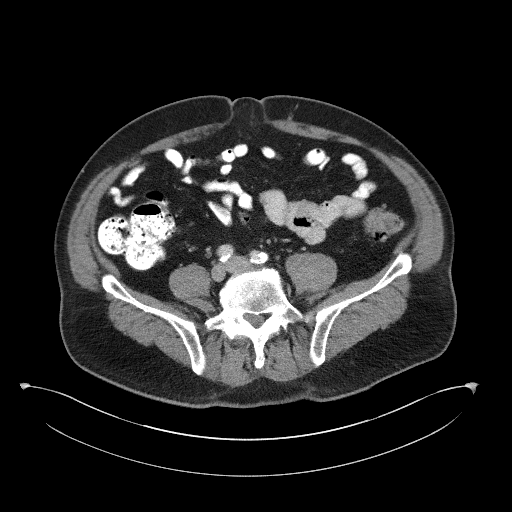
[im 63/115  soft-tissue]
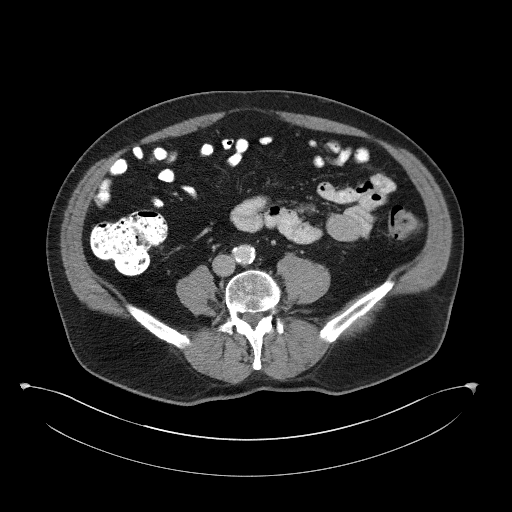
[im 75/115  soft-tissue]
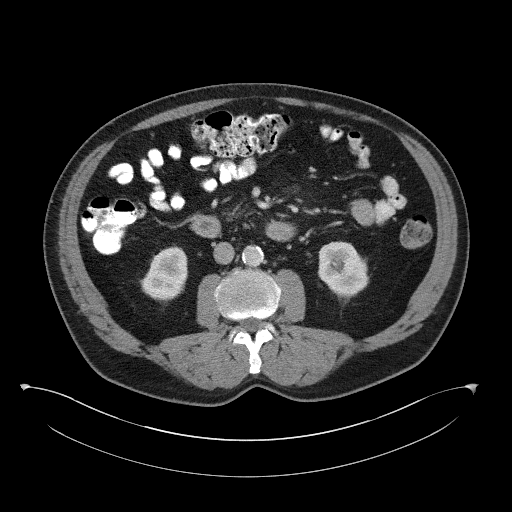
[im 75/115  bone]
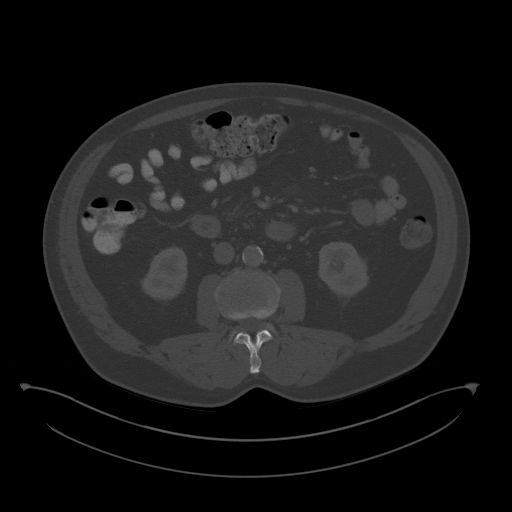
[im 80/115  soft-tissue]
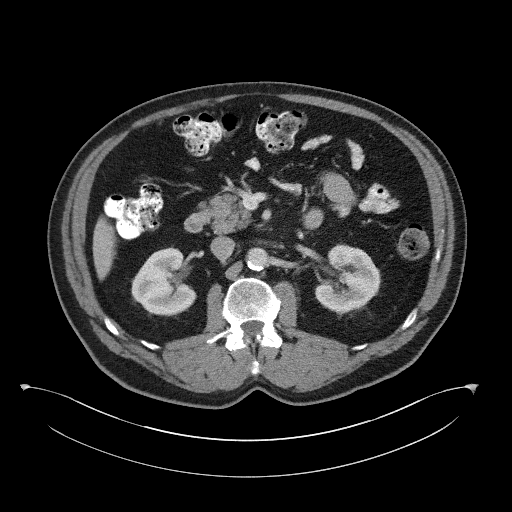
[im 92/115  soft-tissue]
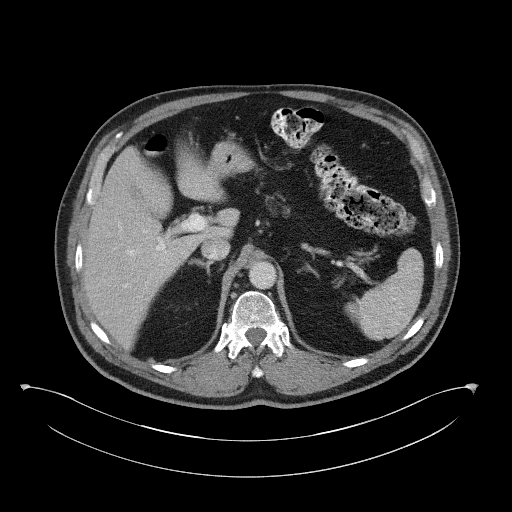
[im 97/115  soft-tissue]
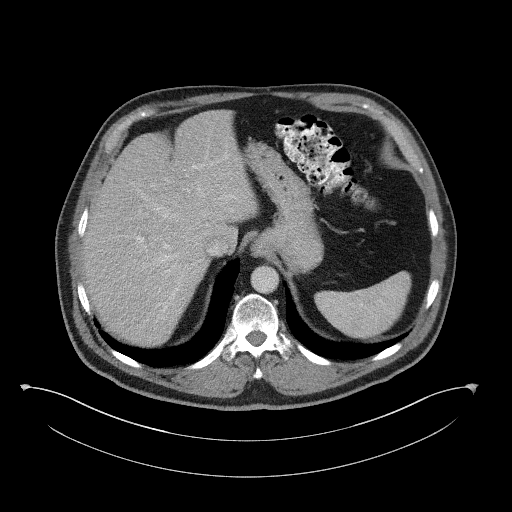
[im 109/115  soft-tissue]
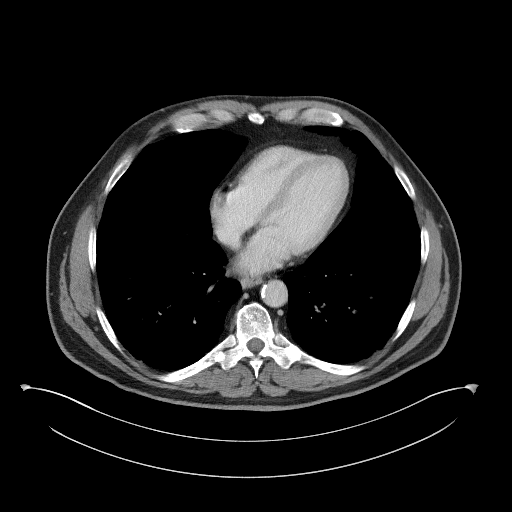

[Series 5: coronal st · coronal · 0.91mm/px · 3 of 111 slices shown]
[im 37/111  soft-tissue]
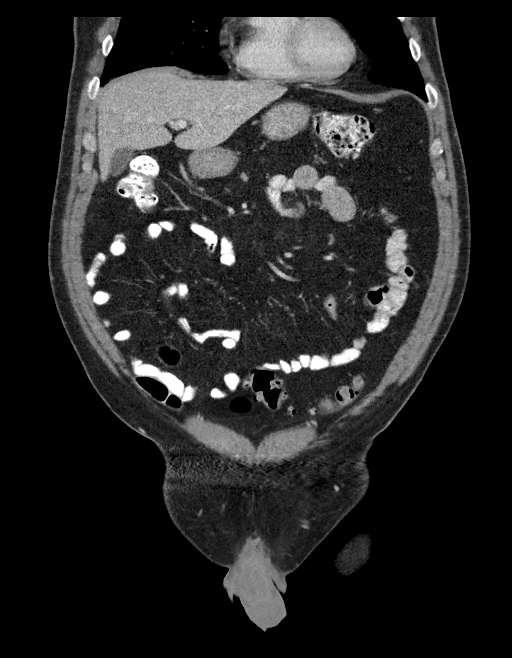
[im 49/111  soft-tissue]
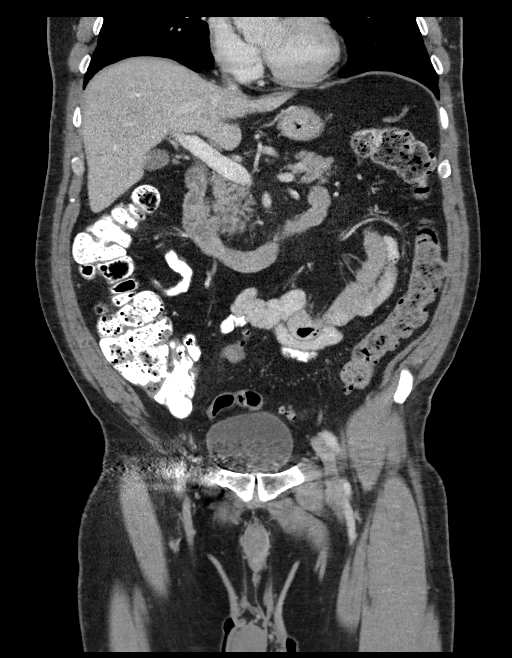
[im 62/111  soft-tissue]
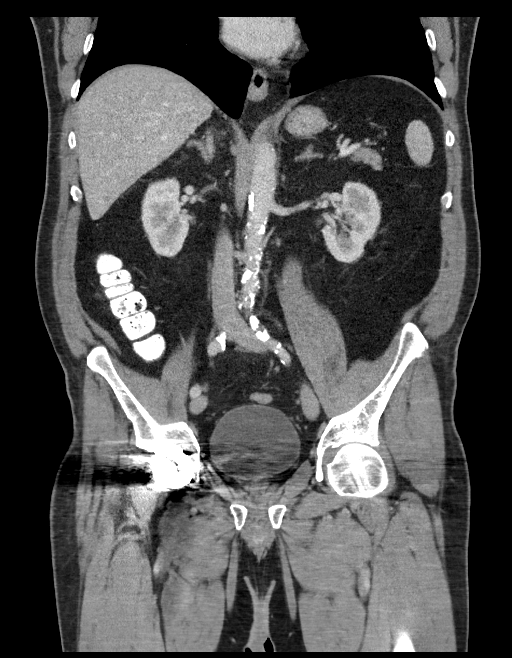

[16 of 46 positions shown; findings below may reference images not displayed]

FINDINGS: Lower chest: Bibasilar subpleural atelectasis. The visualized lung
bases are clear. There is coronary vascular calcification.

No intra-abdominal free air or free fluid.

Hepatobiliary: No focal liver abnormality is seen. No gallstones,
gallbladder wall thickening, or biliary dilatation.

Pancreas: Unremarkable. No pancreatic ductal dilatation or
surrounding inflammatory changes.

Spleen: Normal in size without focal abnormality.

Adrenals/Urinary Tract: The adrenal glands unremarkable. There is a
12 mm right renal upper pole cyst. There is a 5 mm nonobstructing
left renal interpolar calculus. There is no hydronephrosis on either
side. There is symmetric enhancement and excretion of contrast by
both kidneys. The visualized ureters and urinary bladder appear
unremarkable.

Stomach/Bowel: There is sigmoid diverticulosis without active
inflammatory changes. There is no bowel obstruction or active
inflammation. Appendectomy.

Vascular/Lymphatic: Moderate aortoiliac atherosclerotic disease. The
IVC is unremarkable. No portal venous gas. There is no adenopathy.

Reproductive: The prostate and seminal vesicles are grossly
unremarkable. No pelvic mass.

Other: Small fat containing umbilical hernia.

Musculoskeletal: Total right hip arthroplasty. There is degenerative
changes and disc desiccation at L5-S1. No acute osseous pathology.
IMPRESSION: 1. No acute intra-abdominal or pelvic pathology.
2. Sigmoid diverticulosis. No bowel obstruction.
3. A 5 mm nonobstructing left renal interpolar calculus. No
hydronephrosis.
4. Aortic Atherosclerosis ([id number]).

## 2021-06-02 ENCOUNTER — Other Ambulatory Visit: Payer: Self-pay | Admitting: Family Medicine

## 2022-07-16 ENCOUNTER — Emergency Department (HOSPITAL_BASED_OUTPATIENT_CLINIC_OR_DEPARTMENT_OTHER)
Admission: EM | Admit: 2022-07-16 | Discharge: 2022-07-16 | Disposition: A | Discharge status: Home / Self Care | Payer: 59 | Attending: Emergency Medicine | Admitting: Emergency Medicine

## 2022-07-16 ENCOUNTER — Emergency Department (HOSPITAL_BASED_OUTPATIENT_CLINIC_OR_DEPARTMENT_OTHER): Payer: 59

## 2022-07-16 ENCOUNTER — Other Ambulatory Visit: Payer: Self-pay

## 2022-07-16 ENCOUNTER — Encounter (HOSPITAL_BASED_OUTPATIENT_CLINIC_OR_DEPARTMENT_OTHER): Payer: Self-pay

## 2022-07-16 DIAGNOSIS — N132 Hydronephrosis with renal and ureteral calculous obstruction: Secondary | ICD-10-CM | POA: Diagnosis not present

## 2022-07-16 DIAGNOSIS — R1032 Left lower quadrant pain: Secondary | ICD-10-CM | POA: Diagnosis present

## 2022-07-16 DIAGNOSIS — Z79899 Other long term (current) drug therapy: Secondary | ICD-10-CM | POA: Insufficient documentation

## 2022-07-16 DIAGNOSIS — N200 Calculus of kidney: Secondary | ICD-10-CM

## 2022-07-16 LAB — URINALYSIS, ROUTINE W REFLEX MICROSCOPIC
Bilirubin Urine: NEGATIVE
Glucose, UA: NEGATIVE mg/dL
Ketones, ur: NEGATIVE mg/dL
Leukocytes,Ua: NEGATIVE
Nitrite: NEGATIVE
Protein, ur: 30 mg/dL — AB
Specific Gravity, Urine: 1.025 (ref 1.005–1.030)
pH: 5.5 (ref 5.0–8.0)

## 2022-07-16 LAB — CBC
HCT: 42.6 % (ref 39.0–52.0)
Hemoglobin: 14.8 g/dL (ref 13.0–17.0)
MCH: 31.5 pg (ref 26.0–34.0)
MCHC: 34.7 g/dL (ref 30.0–36.0)
MCV: 90.6 fL (ref 80.0–100.0)
Platelets: 279 10*3/uL (ref 150–400)
RBC: 4.7 MIL/uL (ref 4.22–5.81)
RDW: 12.9 % (ref 11.5–15.5)
WBC: 12.9 10*3/uL — ABNORMAL HIGH (ref 4.0–10.5)
nRBC: 0 % (ref 0.0–0.2)

## 2022-07-16 LAB — URINALYSIS, MICROSCOPIC (REFLEX)

## 2022-07-16 LAB — BASIC METABOLIC PANEL
Anion gap: 9 (ref 5–15)
BUN: 17 mg/dL (ref 8–23)
CO2: 22 mmol/L (ref 22–32)
Calcium: 9.2 mg/dL (ref 8.9–10.3)
Chloride: 103 mmol/L (ref 98–111)
Creatinine, Ser: 1.13 mg/dL (ref 0.61–1.24)
GFR, Estimated: >60 mL/min (ref >60)
Glucose, Bld: 119 mg/dL — ABNORMAL HIGH (ref 70–99)
Potassium: 3.8 mmol/L (ref 3.5–5.1)
Sodium: 134 mmol/L — ABNORMAL LOW (ref 135–145)

## 2022-07-16 MED ORDER — KETOROLAC TROMETHAMINE 15 MG/ML IJ SOLN
15.0000 mg | Freq: Once | INTRAMUSCULAR | Status: AC
  Administered 2022-07-16: 15 mg via INTRAVENOUS
  Filled 2022-07-16: qty 1

## 2022-07-16 MED ORDER — MORPHINE SULFATE (PF) 4 MG/ML IV SOLN: 4.0000 mg | INTRAVENOUS | Status: DC

## 2022-07-16 MED ORDER — OXYCODONE HCL 5 MG PO TABS: 5.0000 mg | ORAL_TABLET | ORAL | 0 refills | Status: DC | PRN

## 2022-07-16 MED ORDER — TAMSULOSIN HCL 0.4 MG PO CAPS: 0.4000 mg | ORAL_CAPSULE | Freq: Every day | ORAL | 0 refills | Status: AC

## 2022-07-16 NOTE — ED Notes (Addendum)
Discharge instructions reviewed with patient. Patient verbalizes understanding, no further questions at this time. Medications/prescriptions and follow up information provided. No acute distress noted at time of departure. Strainer provided for urine

## 2022-07-16 NOTE — ED Triage Notes (Signed)
C/o left groin pain since waking up this morning. C/o burning with urination/trouble urinating.

## 2022-07-16 NOTE — ED Provider Notes (Signed)
[place] [facility] Provider Note   CSN: [id number] Arrival date & time: 07/16/22  7829     History  Chief Complaint  Patient presents with   Groin Pain    Darryl Ramsey is a 67 y.o. male.  67 year old male with a history of kidney stones who presents the emergency department with left groin pain.  Says that this morning he woke up and had left groin pain.  Feels the urge to void and has some slight burning when he pees.  No hematuria.  Says it feels similar to prior kidney stones but they are typically on the right side.  No testicular pain or swelling.  No fevers or flank pain.  No abdominal pain otherwise.       Home Medications Prior to Admission medications   Medication Sig Start Date End Date Taking? Authorizing Provider  oxyCODONE (ROXICODONE) 5 MG immediate release tablet Take 1 tablet (5 mg total) by mouth every 4 (four) hours as needed for severe pain. 07/16/22  Yes Rondel Baton, MD  tamsulosin (FLOMAX) 0.4 MG CAPS capsule Take 1 capsule (0.4 mg total) by mouth daily. 07/16/22 08/15/22 Yes Rondel Baton, MD  ALPRAZolam Prudy Feeler) 0.25 MG tablet TAKE 1/2 TO 1 (ONE-HALF TO ONE) TABLET BY MOUTH TWICE DAILY AS NEEDED FOR ANXIETY 04/23/20   Bradd Canary, MD  atorvastatin (LIPITOR) 40 MG tablet TAKE 1 TABLET BY MOUTH ONCE DAILY IN THE MORNING 02/07/20   Bradd Canary, MD  azithromycin (ZITHROMAX) 250 MG tablet Take 2 tablets by mouth on day 1, followed by 1 tablet by mouth daily for 4 days. Patient not taking: Reported on 01/12/2020 01/05/20   Saguier, Ramon Dredge, PA-C  famotidine (PEPCID) 40 MG tablet Take 1 tablet by mouth once daily 03/08/21   Bradd Canary, MD  lisinopril (ZESTRIL) 5 MG tablet TAKE 1 TABLET BY MOUTH IN THE MORNING 05/04/20   Bradd Canary, MD  metoprolol succinate (TOPROL-XL) 25 MG 24 hr tablet Take 1 tablet (25 mg total) by mouth every morning. 10/05/18   Bradd Canary, MD  nitroGLYCERIN (NITROSTAT) 0.4 MG  SL tablet Place 0.4 mg under the tongue every 5 (five) minutes as needed for chest pain.    [provider]      Allergies    Patient has no known allergies.    Review of Systems   Review of Systems  Physical Exam Updated Vital Signs BP (!) 146/87 (BP Location: Left Arm)   Pulse (!) 101   Temp 98.1 F (36.7 C) (Oral)   Resp 20   Ht  (1.753 m)   Wt 99.8 kg   SpO2 95%   BMI 32.49 kg/m  Physical Exam Vitals and nursing note reviewed.  Constitutional:      General: He is not in acute distress.    Appearance: He is well-developed.  HENT:     Head: Normocephalic and atraumatic.     Right Ear: External ear normal.     Left Ear: External ear normal.     Nose: Nose normal.  Eyes:     Extraocular Movements: Extraocular movements intact.     Conjunctiva/sclera: Conjunctivae normal.     Pupils: Pupils are equal, round, and reactive to light.  Pulmonary:     Effort: Pulmonary effort is normal. No respiratory distress.  Abdominal:     General: There is no distension.     Palpations: Abdomen is soft. There is no mass.  Tenderness: There is no abdominal tenderness. There is no right CVA tenderness, left CVA tenderness or guarding.  Genitourinary:    Comments: GU exam chaperoned by patient's nurse.  Penis and testes normal.  No tenderness to palpation of the epididymis.  Normal lie of the testicles bilaterally.  No inguinal hernia or swelling. Musculoskeletal:     Cervical back: Normal range of motion and neck supple.     Right lower leg: No edema.     Left lower leg: No edema.  Skin:    General: Skin is warm and dry.  Neurological:     Mental Status: He is alert. Mental status is at baseline.  Psychiatric:        Mood and Affect: Mood normal.        Behavior: Behavior normal.     ED Results / Procedures / Treatments   Labs (all labs ordered are listed, but only abnormal results are displayed) Labs Reviewed  URINALYSIS, ROUTINE W REFLEX MICROSCOPIC -  Abnormal; Notable for the following components:      Result Value   Hgb urine dipstick LARGE (*)    Protein, ur 30 (*)    All other components within normal limits  URINALYSIS, MICROSCOPIC (REFLEX) - Abnormal; Notable for the following components:   Bacteria, UA RARE (*)    Non Squamous Epithelial PRESENT (*)    All other components within normal limits  BASIC METABOLIC PANEL - Abnormal; Notable for the following components:   Sodium 134 (*)    Glucose, Bld 119 (*)    All other components within normal limits  CBC - Abnormal; Notable for the following components:   WBC 12.9 (*)    All other components within normal limits    EKG None  Radiology CT Renal Stone Study  Result Date: 07/16/2022 CLINICAL DATA:  Abdominal/flank pain, stone suspected. Left groin pain. EXAM: CT ABDOMEN AND PELVIS WITHOUT CONTRAST TECHNIQUE: Multidetector CT imaging of the abdomen and pelvis was performed following the standard protocol without IV contrast. RADIATION DOSE REDUCTION: This exam was performed according to the departmental dose-optimization program which includes automated exposure control, adjustment of the mA and/or kV according to patient size and/or use of iterative reconstruction technique. COMPARISON:  CT abdomen/pelvis 02/01/2020. FINDINGS: Lower chest: No acute findings. Atherosclerotic calcifications of the coronary arteries. Hepatobiliary: No focal liver abnormality is seen. No gallstones, gallbladder wall thickening, or biliary dilatation. Pancreas: Unremarkable. No pancreatic ductal dilatation or surrounding inflammatory changes. Spleen: Normal in size without focal abnormality. Adrenals/Urinary Tract: Adrenal glands are unremarkable. Mild left hydroureteronephrosis with moderate left perinephric stranding and 4 mm stone at the left ureterovesicular junction (axial image 77 series 2). Additional calculi are seen in the left kidney, measuring up to 8 mm in the interpolar region (coronal image 45  series 5). A 6 mm calculus is seen in the left base of the bladder, near the left ureteral orifice. Stomach/Bowel: Normal stomach and duodenum. No dilated loops of small bowel. Appendix is not visualized. Descending and sigmoid diverticulosis without surrounding inflammation to suggest acute diverticulitis. Vascular/Lymphatic: Aortic atherosclerosis. No enlarged abdominal or pelvic lymph nodes. Reproductive: Prostate is unremarkable. Other: Fat containing umbilical hernia. No abdominal or pelvic ascites. Musculoskeletal: Right total hip arthroplasty. No suspicious bone lesions. IMPRESSION: 1. Mild left hydroureteronephrosis with moderate left perinephric stranding and 4 mm stone at the left ureterovesicular junction. 2. Additional nonobstructing calculi in the left kidney, measuring up to 8 mm in the interpolar region. 3. 6 mm calculus in the  left base of the bladder, near the left ureteral orifice. 4. Descending and sigmoid diverticulosis without surrounding inflammation to suggest acute diverticulitis. 5. Coronary atherosclerosis. Aortic Atherosclerosis (ICD10-I70.0). Electronically Signed   By: Orvan Falconer M.D.   On: 07/16/2022 11:19    Procedures Procedures   Medications Ordered in ED Medications  ketorolac (TORADOL) 15 MG/ML injection 15 mg (15 mg Intravenous Given 07/16/22 1140)    ED Course/ Medical Decision Making/ A&P                             Medical Decision Making Amount and/or Complexity of Data Reviewed Labs: ordered. Radiology: ordered.  Risk Prescription drug management.   Darryl Ramsey is a 67 y.o. male with comorbidities that complicate the patient evaluation including kidney stones who presents to the emergency department with left groin pain  Initial Ddx:  Kidney stone, pyelonephritis, UTI, epididymitis, hernia  MDM:  The patient may have a kidney stone based on his symptoms and history.  No fevers or chills or flank pain to suggest a pyelonephritis.  Will  check urinalysis to see if his kidney stone could be infected or if he has a UTI.  No testicular tenderness palpation to suggest epididymitis or other testicular pathology.  Hernia not present on exam.  Plan:  Labs Urinalysis CT stone protocol Toradol  ED Summary/Re-evaluation:  Patient underwent the above workup which did show a 6 mm calculus that passed into the bladder and a 4 mm distal stone.  Urinalysis does not appear to be consistent with UTI.  No AKI on his lab work.  Feel this will likely pass without any advanced interventions and the patient was sent home with instructions to take over-the-counter analgesia and was given oxycodone for breakthrough pain.  Also was given prescription for Flomax to aid with stone expulsion and a strainer.  Instructed to follow-up with urology in 1 to 2 weeks.  This patient presents to the ED for concern of complaints listed in HPI, this involves an extensive number of treatment options, and is a complaint that carries with it a high risk of complications and morbidity. Disposition including potential need for admission considered.   Dispo: DC Home. Return precautions discussed including, but not limited to, those listed in the AVS. Allowed pt time to ask questions which were answered fully prior to dc.  Records reviewed Outpatient Clinic Notes The following labs were independently interpreted: Chemistry and show no acute abnormality I independently reviewed the following imaging with scope of interpretation limited to determining acute life threatening conditions related to emergency care: CT Abdomen/Pelvis and agree with the radiologist interpretation with the following exceptions: None I personally reviewed and interpreted cardiac monitoring: normal sinus rhythm  I personally reviewed and interpreted the pt's EKG: see above for interpretation  I have reviewed the patients home medications and made adjustments as needed Social Determinants of health:   Elderly  Final Clinical Impression(s) / ED Diagnoses Final diagnoses:  Kidney stone    Rx / DC Orders ED Discharge Orders          Ordered    tamsulosin (FLOMAX) 0.4 MG CAPS capsule  Daily        07/16/22 1205    oxyCODONE (ROXICODONE) 5 MG immediate release tablet  Every 4 hours PRN        07/16/22 1206              Rondel Baton, MD  07/16/22 1658  

## 2022-07-16 NOTE — Discharge Instructions (Signed)
You were seen for your kidney stone in the emergency department.    At home, please take Tylenol and ibuprofen for your pain. You may also take the oxycodone we have prescribed you for any breakthrough pain that may have.  Do not take this before driving or operating heavy machinery.  Do not take this medication with alcohol.  Use the flowmax we have give you every day until the kidney stone passes. Please also strain your urine to collect the kidney stone. Store it in a container and take it to your urologist for analysis.   Follow-up with urology in a week to discuss your symptoms.   Return immediately to the emergency department if you experience any of the following: fever, unbearable pain, urinary retention, or any other concerning symptoms.    Thank you for visiting our Emergency Department. It was a pleasure taking care of you today.   

## 2022-07-16 NOTE — ED Notes (Signed)
Shadowed EDP for assessment.

## 2022-09-26 ENCOUNTER — Ambulatory Visit: Payer: 59 | Admitting: Family

## 2022-09-26 VITALS — BP 136/68 | HR 62 | Temp 98.0°F | Resp 18 | Ht 69.0 in | Wt 213.4 lb

## 2022-09-26 DIAGNOSIS — H9201 Otalgia, right ear: Secondary | ICD-10-CM | POA: Diagnosis not present

## 2022-09-26 MED ORDER — AMOXICILLIN 500 MG PO CAPS: 500.0000 mg | ORAL_CAPSULE | Freq: Three times a day (TID) | ORAL | 0 refills | Status: AC

## 2022-09-26 NOTE — Progress Notes (Signed)
Subjective:     Patient ID: Darryl Ramsey, male    DOB: Jul 12, 1955, 67 y.o.   MRN: [id number]  Chief Complaint  Patient presents with   Ear Pain    Right onset: 1 day    HPI  Discussed the use of AI scribe software for clinical note transcription with the patient, who gave verbal consent to proceed.  History of Present Illness   The patient presents with right ear pain that started earlier in the day and persisted despite taking Advil. The pain is described as intense, and it seems to be localized to the right side of the head, causing discomfort. The patient also reports sinus congestion and a runny nose on the same side, suggesting possible allergies. He denies any recent fever. The patient also mentions a history of ear wax build-up, which has been a recurring issue.          Health Maintenance Due  Topic Date Due   Pneumonia Vaccine 52+ Years old (1 of 2 - PCV) Never done   HIV Screening  Never done   Hepatitis C Screening  Never done   DTaP/Tdap/Td (1 - Tdap) Never done   Colonoscopy  Never done   Zoster Vaccines- Shingrix (1 of 2) Never done   COVID-19 Vaccine (2 - 2023-24 season) 11/22/2021    Past Medical History:  Diagnosis Date   Arthritis    Coronary artery disease    GERD (gastroesophageal reflux disease)    History of chicken pox 10/06/2018   History of kidney stones    Hyperlipidemia    Hypertension    Myocardial infarct [facility]) 2011   Renal calculi    Renal disorder    left ureteral stone    Past Surgical History:  Procedure Laterality Date   APPENDECTOMY     BACK SURGERY  2007   lumbar, no hardware   CORONARY ANGIOPLASTY     stents    CORONARY STENT PLACEMENT  2011   patient says he had an MI and coronary stent 2011. No coronary problems since   CYSTOSCOPY WITH STENT PLACEMENT  2006 and in 80's   TONSILLECTOMY     and adenoids   TOTAL HIP ARTHROPLASTY Right 12/30/2016   Procedure: RIGHT TOTAL HIP ARTHROPLASTY ANTERIOR APPROACH;  Surgeon:  Durene Romans, MD;  Location: [facility];  Service: Orthopedics;  Laterality: Right;  70 mins    Family History  Problem Relation Age of Onset   Cancer Mother        breast with mets to brain   Diabetes Father    Hypertension Father    Heart disease Father    Hyperlipidemia Father    Gallstones Brother    Cancer Paternal Grandfather        lung    Social History   Socioeconomic History   Marital status: Married    Spouse name: Not on file   Number of children: Not on file   Years of education: Not on file   Highest education level: 12th grade  Occupational History   Not on file  Tobacco Use   Smoking status: Former    Types: Cigarettes    Quit date: 2011    Years since quitting: 13.5   Smokeless tobacco: Former  Building services engineer Use: Never used  Substance and Sexual Activity   Alcohol use: Yes    Comment: rare   Drug use: No   Sexual activity: Not on file  Other Topics  Concern   Not on file  Social History Narrative   Not on file   Social Determinants of Health   Financial Resource Strain: Low Risk  (09/26/2022)   Overall Financial Resource Strain (CARDIA)    Difficulty of Paying Living Expenses: Not hard at all  Food Insecurity: No Food Insecurity (09/26/2022)   Hunger Vital Sign    Worried About Running Out of Food in the Last Year: Never true    Ran Out of Food in the Last Year: Never true  Transportation Needs: No Transportation Needs (09/26/2022)   PRAPARE - Administrator, Civil Service (Medical): No    Lack of Transportation (Non-Medical): No  Physical Activity: Unknown (09/26/2022)   Exercise Vital Sign    Days of Exercise per Week: Patient declined    Minutes of Exercise per Session: Not on file  Stress: No Stress Concern Present (09/26/2022)   [employer] of Occupational Health - Occupational Stress Questionnaire    Feeling of Stress : Only a little  Social Connections: Unknown (09/26/2022)   Social Connection and Isolation Panel [NHANES]     Frequency of Communication with Friends and Family: Patient declined    Frequency of Social Gatherings with Friends and Family: Patient declined    Attends Religious Services: Patient declined    Database administrator or Organizations: Patient declined    Attends Engineer, structural: Not on file    Marital Status: Married  Catering manager Violence: Not on file    Outpatient Medications Prior to Visit  Medication Sig Dispense Refill   atorvastatin (LIPITOR) 40 MG tablet TAKE 1 TABLET BY MOUTH ONCE DAILY IN THE MORNING 90 tablet 1   azithromycin (ZITHROMAX) 250 MG tablet Take 2 tablets by mouth on day 1, followed by 1 tablet by mouth daily for 4 days. (Patient not taking: Reported on 01/12/2020) 6 tablet 0   famotidine (PEPCID) 40 MG tablet Take 1 tablet by mouth once daily 30 tablet 0   lisinopril (ZESTRIL) 5 MG tablet TAKE 1 TABLET BY MOUTH IN THE MORNING 90 tablet 0   metoprolol succinate (TOPROL-XL) 25 MG 24 hr tablet Take 1 tablet (25 mg total) by mouth every morning. 90 tablet 1   nitroGLYCERIN (NITROSTAT) 0.4 MG SL tablet Place 0.4 mg under the tongue every 5 (five) minutes as needed for chest pain.     ALPRAZolam (XANAX) 0.25 MG tablet TAKE 1/2 TO 1 (ONE-HALF TO ONE) TABLET BY MOUTH TWICE DAILY AS NEEDED FOR ANXIETY 30 tablet 0   oxyCODONE (ROXICODONE) 5 MG immediate release tablet Take 1 tablet (5 mg total) by mouth every 4 (four) hours as needed for severe pain. 12 tablet 0   No facility-administered medications prior to visit.    No Known Allergies  ROS See HPI    Objective:    Physical Exam Constitutional:      Appearance: Normal appearance.  HENT:     Head: Normocephalic and atraumatic.     Right Ear: Tympanic membrane normal. There is impacted cerumen.     Left Ear: Tympanic membrane normal. There is impacted cerumen.  Neurological:     Mental Status: He is alert.      BP 136/68   Pulse 62   Temp 98 F (36.7 C)   Resp 18   Ht 5\' 9"  (1.753 m)    Wt 213 lb 6.4 oz (96.8 kg)   SpO2 97%   BMI 31.51 kg/m  Wt Readings from Last 3  Encounters:  09/26/22 213 lb 6.4 oz (96.8 kg)  07/16/22 220 lb (99.8 kg)  01/12/20 220 lb (99.8 kg)       Assessment & Plan:   Problem List Items Addressed This Visit       Unprioritized   Otalgia of right ear - Primary    Pt has bilateral cerumen impaction.  After verbal consent CMA irrigated both ears which resulted in some wax removal but the TM's were still not visible due to deep remaining wax.  I tried to remove some of the wax with a currette from the right ear but the wax was very hard and pt had discomfort so I stopped.  Will plan empiric rx with amoxicillin for presumed OM.  He is advised on use of home ear wax removal kits to continue to work on cleaning both ears at home.        I have discontinued Iago Cottingham. Reither "Tim"'s ALPRAZolam and oxyCODONE. I am also having him start on amoxicillin. Additionally, I am having him maintain his nitroGLYCERIN, metoprolol succinate, azithromycin, atorvastatin, lisinopril, and famotidine.  Meds ordered this encounter  Medications   amoxicillin (AMOXIL) 500 MG capsule    Sig: Take 1 capsule (500 mg total) by mouth 3 (three) times daily for 10 days.    Dispense:  30 capsule    Refill:  0    Order Specific Question:   Supervising Provider    Answer:   Danise Edge A [[id number]]

## 2022-09-26 NOTE — Assessment & Plan Note (Signed)
Pt has bilateral cerumen impaction.  After verbal consent CMA irrigated both ears which resulted in some wax removal but the TM's were still not visible due to deep remaining wax.  I tried to remove some of the wax with a currette from the right ear but the wax was very hard and pt had discomfort so I stopped.  Will plan empiric rx with amoxicillin for presumed OM.  He is advised on use of home ear wax removal kits to continue to work on cleaning both ears at home.

## 2022-11-25 ENCOUNTER — Ambulatory Visit: Payer: 59 | Admitting: Family

## 2022-11-25 ENCOUNTER — Ambulatory Visit (HOSPITAL_BASED_OUTPATIENT_CLINIC_OR_DEPARTMENT_OTHER)
Admission: RE | Admit: 2022-11-25 | Discharge: 2022-11-25 | Disposition: A | Discharge status: Home / Self Care | Payer: 59 | Source: Ambulatory Visit | Attending: Family | Admitting: Family

## 2022-11-25 VITALS — BP 137/81 | HR 80 | Temp 97.7°F | Resp 16 | Wt 213.0 lb

## 2022-11-25 DIAGNOSIS — G8929 Other chronic pain: Secondary | ICD-10-CM

## 2022-11-25 DIAGNOSIS — M545 Low back pain, unspecified: Secondary | ICD-10-CM | POA: Diagnosis present

## 2022-11-25 LAB — POC URINALSYSI DIPSTICK (AUTOMATED)
Blood, UA: NEGATIVE
Glucose, UA: NEGATIVE
Leukocytes, UA: NEGATIVE
Nitrite, UA: NEGATIVE
Protein, UA: NEGATIVE
Spec Grav, UA: 1.02 (ref 1.010–1.025)
Urobilinogen, UA: 2 U/dL — AB
pH, UA: 6 (ref 5.0–8.0)

## 2022-11-25 NOTE — Patient Instructions (Signed)
VISIT SUMMARY:  During your visit, we discussed your lower back pain and the slight burning sensation you've been experiencing during urination. Your back pain has been making it difficult for you to move and drive. You also mentioned a history of a lumbar disc repair surgery. As for the burning sensation during urination, you mentioned it reminds you of past kidney infections, but you haven't noticed any fever or blood in your urine.  YOUR PLAN:  -LOWER BACK PAIN: Your lower back pain could be due to your recent fall or related to your past disc repair surgery. To understand better, we will do an X-ray of your lower spine and check your kidney function in the lab.  -POSSIBLE URINARY TRACT INFECTION: The burning sensation during urination could be a sign of a urinary tract infection, especially given your history of kidney stones and infections. We will send your urine for culture to confirm this. Your urinalysis does not suggest a definite  infection.   INSTRUCTIONS:  We will let you know the results of your tests. If there are any abnormalities, we will call you. If everything is normal, you will receive a notification through MyChart. Please contact the office if your symptoms worsen or if you develop a fever.

## 2022-11-25 NOTE — Progress Notes (Signed)
Subjective:     Patient ID: Darryl Ramsey, male    DOB: Nov 06, 1955, 67 y.o.   MRN: [id number]  Chief Complaint  Patient presents with   Back Pain    Patient complains of bilateral back pain for over one week    Back Pain    Discussed the use of AI scribe software for clinical note transcription with the patient, who gave verbal consent to proceed.  History of Present Illness   The patient presents with bilateral lower back pain that started two to three days prior to a fall off a trailer on 11/19/22. The pain is described as hurting   'like crap' and is exacerbated by certain movements, such as bending and driving over bumps. The pain has been so severe that the patient had difficulty getting out of bed. The patient has a history of discectomy surgery at L5 S1 remotely. He  denies any radiating pain down the legs.  In addition to the back pain, the patient reports a slight burning sensation during urination and possibly increased frequency of urination. The patient has a history of kidney stones and kidney infections, and the current symptoms are reminiscent of past kidney infections. However, the patient denies any fever or blood in the urine.      Health Maintenance Due  Topic Date Due   HIV Screening  Never done   Hepatitis C Screening  Never done   DTaP/Tdap/Td (1 - Tdap) Never done   Colonoscopy  Never done   Zoster Vaccines- Shingrix (1 of 2) Never done   Pneumonia Vaccine 70+ Years old (1 of 1 - PCV) Never done   INFLUENZA VACCINE  10/23/2022   COVID-19 Vaccine (2 - 2023-24 season) 11/23/2022    Past Medical History:  Diagnosis Date   Arthritis    Coronary artery disease    GERD (gastroesophageal reflux disease)    History of chicken pox 10/06/2018   History of kidney stones    Hyperlipidemia    Hypertension    Myocardial infarct [facility]) 2011   Renal calculi    Renal disorder    left ureteral stone    Past Surgical History:  Procedure Laterality Date    APPENDECTOMY     BACK SURGERY  2007   lumbar, no hardware   CORONARY ANGIOPLASTY     stents    CORONARY STENT PLACEMENT  2011   patient says he had an MI and coronary stent 2011. No coronary problems since   CYSTOSCOPY WITH STENT PLACEMENT  2006 and in 80's   TONSILLECTOMY     and adenoids   TOTAL HIP ARTHROPLASTY Right 12/30/2016   Procedure: RIGHT TOTAL HIP ARTHROPLASTY ANTERIOR APPROACH;  Surgeon: Durene Romans, MD;  Location: [facility];  Service: Orthopedics;  Laterality: Right;  70 mins    Family History  Problem Relation Age of Onset   Cancer Mother        breast with mets to brain   Diabetes Father    Hypertension Father    Heart disease Father    Hyperlipidemia Father    Gallstones Brother    Cancer Paternal Grandfather        lung    Social History   Socioeconomic History   Marital status: Married    Spouse name: Not on file   Number of children: Not on file   Years of education: Not on file   Highest education level: 12th grade  Occupational History   Not on file  Tobacco Use   Smoking status: Former    Current packs/day: 0.00    Types: Cigarettes    Quit date: 2011    Years since quitting: 13.6   Smokeless tobacco: Former  Building services engineer status: Never Used  Substance and Sexual Activity   Alcohol use: Yes    Comment: rare   Drug use: No   Sexual activity: Not on file  Other Topics Concern   Not on file  Social History Narrative   Not on file   Social Determinants of Health   Financial Resource Strain: Low Risk  (09/26/2022)   Overall Financial Resource Strain (CARDIA)    Difficulty of Paying Living Expenses: Not hard at all  Food Insecurity: No Food Insecurity (09/26/2022)   Hunger Vital Sign    Worried About Running Out of Food in the Last Year: Never true    Ran Out of Food in the Last Year: Never true  Transportation Needs: No Transportation Needs (09/26/2022)   PRAPARE - Administrator, Civil Service (Medical): No    Lack of  Transportation (Non-Medical): No  Physical Activity: Unknown (09/26/2022)   Exercise Vital Sign    Days of Exercise per Week: Patient declined    Minutes of Exercise per Session: Not on file  Stress: No Stress Concern Present (09/26/2022)   [employer] of Occupational Health - Occupational Stress Questionnaire    Feeling of Stress : Only a little  Social Connections: Unknown (09/26/2022)   Social Connection and Isolation Panel [NHANES]    Frequency of Communication with Friends and Family: Patient declined    Frequency of Social Gatherings with Friends and Family: Patient declined    Attends Religious Services: Patient declined    Database administrator or Organizations: Patient declined    Attends Engineer, structural: Not on file    Marital Status: Married  Catering manager Violence: Not on file    Outpatient Medications Prior to Visit  Medication Sig Dispense Refill   atorvastatin (LIPITOR) 40 MG tablet TAKE 1 TABLET BY MOUTH ONCE DAILY IN THE MORNING 90 tablet 1   famotidine (PEPCID) 40 MG tablet Take 1 tablet by mouth once daily 30 tablet 0   lisinopril (ZESTRIL) 5 MG tablet TAKE 1 TABLET BY MOUTH IN THE MORNING 90 tablet 0   metoprolol succinate (TOPROL-XL) 25 MG 24 hr tablet Take 1 tablet (25 mg total) by mouth every morning. 90 tablet 1   nitroGLYCERIN (NITROSTAT) 0.4 MG SL tablet Place 0.4 mg under the tongue every 5 (five) minutes as needed for chest pain.     azithromycin (ZITHROMAX) 250 MG tablet Take 2 tablets by mouth on day 1, followed by 1 tablet by mouth daily for 4 days. (Patient not taking: Reported on 01/12/2020) 6 tablet 0   No facility-administered medications prior to visit.    No Known Allergies  Review of Systems  Musculoskeletal:  Positive for back pain.       Objective:    Physical Exam Constitutional:      General: He is not in acute distress.    Appearance: He is well-developed.  HENT:     Head: Normocephalic and atraumatic.   Cardiovascular:     Rate and Rhythm: Normal rate and regular rhythm.     Heart sounds: No murmur heard. Pulmonary:     Effort: Pulmonary effort is normal. No respiratory distress.     Breath sounds: Normal breath sounds. No wheezing or rales.  Abdominal:     Comments: ? Mild cvat bilaterally R>L  Musculoskeletal:     Thoracic back: Normal. No swelling or tenderness.     Lumbar back: Normal. No swelling or tenderness.       Back:     Comments: Pain across lower back  Skin:    General: Skin is warm and dry.  Neurological:     Mental Status: He is alert and oriented to person, place, and time.  Psychiatric:        Behavior: Behavior normal.        Thought Content: Thought content normal.      BP 137/81 (BP Location: Left Arm, Patient Position: Sitting, Cuff Size: Small)   Pulse 80   Temp 97.7 F (36.5 C) (Oral)   Resp 16   Wt 213 lb (96.6 kg)   SpO2 97%   BMI 31.45 kg/m  Wt Readings from Last 3 Encounters:  11/25/22 213 lb (96.6 kg)  09/26/22 213 lb 6.4 oz (96.8 kg)  07/16/22 220 lb (99.8 kg)       Assessment & Plan:   Problem List Items Addressed This Visit       Unprioritized   Bilateral low back pain without sciatica - Primary    New.  Urinalysis unremarkable.  Will send for culture to exclude infection. Obtain KUB to evaluate for stones. I suspect that his pain is most likely musculoskeletal in nature.      Relevant Orders   POCT Urinalysis Dipstick (Automated) (Completed)   Urine Culture   Basic Metabolic Panel (BMET)   DG Abd 2 Views    I have discontinued Arvel S. Costanza "Tim"'s azithromycin. I am also having him maintain his nitroGLYCERIN, metoprolol succinate, atorvastatin, lisinopril, and famotidine.  No orders of the defined types were placed in this encounter.

## 2022-11-25 NOTE — Assessment & Plan Note (Signed)
New.  Urinalysis unremarkable.  Will send for culture to exclude infection. Obtain KUB to evaluate for stones. I suspect that his pain is most likely musculoskeletal in nature.

## 2022-11-26 LAB — URINE CULTURE
MICRO NUMBER:: 15413316
Result:: NO GROWTH
SPECIMEN QUALITY:: ADEQUATE

## 2022-11-26 LAB — BASIC METABOLIC PANEL
BUN: 18 mg/dL (ref 6–23)
CO2: 26 meq/L (ref 19–32)
Calcium: 9.1 mg/dL (ref 8.4–10.5)
Chloride: 103 meq/L (ref 96–112)
Creatinine, Ser: 1.26 mg/dL (ref 0.40–1.50)
GFR: 59.78 mL/min — ABNORMAL LOW (ref >60.00)
Glucose, Bld: 94 mg/dL (ref 70–99)
Potassium: 4.2 meq/L (ref 3.5–5.1)
Sodium: 136 meq/L (ref 135–145)

## 2022-11-27 ENCOUNTER — Encounter: Payer: Self-pay | Admitting: Family

## 2022-11-27 DIAGNOSIS — R109 Unspecified abdominal pain: Secondary | ICD-10-CM

## 2022-11-27 NOTE — Telephone Encounter (Signed)
Pancreas is located left upper part of abdomen.  Liver and gallbladder are on the right.  Let's have him complete some additional lab work (liver tests, pancreatitis tests).  I am also placing an order CT abdomen for further evaluation.

## 2022-11-28 ENCOUNTER — Ambulatory Visit (HOSPITAL_COMMUNITY)
Admission: RE | Admit: 2022-11-28 | Discharge: 2022-11-28 | Disposition: A | Discharge status: Home / Self Care | Payer: 59 | Source: Ambulatory Visit | Attending: Family | Admitting: Family

## 2022-11-28 ENCOUNTER — Other Ambulatory Visit (INDEPENDENT_AMBULATORY_CARE_PROVIDER_SITE_OTHER): Payer: 59

## 2022-11-28 ENCOUNTER — Telehealth: Payer: Self-pay | Admitting: Family

## 2022-11-28 DIAGNOSIS — R109 Unspecified abdominal pain: Secondary | ICD-10-CM

## 2022-11-28 DIAGNOSIS — S22080A Wedge compression fracture of T11-T12 vertebra, initial encounter for closed fracture: Secondary | ICD-10-CM

## 2022-11-28 MED ORDER — IOHEXOL 350 MG/ML SOLN
75.0000 mL | Freq: Once | INTRAVENOUS | Status: AC | PRN
  Administered 2022-11-28: 75 mL via INTRAVENOUS

## 2022-11-28 MED ORDER — TRAMADOL HCL 50 MG PO TABS
50.0000 mg | ORAL_TABLET | Freq: Four times a day (QID) | ORAL | 0 refills | Status: AC | PRN
Start: 2022-11-28 — End: 2022-12-03

## 2022-11-28 NOTE — Telephone Encounter (Signed)
Patient notified of information from provider. He had the CT already and he will come in this afternoon for further labs.

## 2022-11-28 NOTE — Telephone Encounter (Signed)
Patient presented to the lab for lab appointment. Asked CMA if I could prescribe something for pain.  I reviewed his CT scan which noted the following:   IMPRESSION: 1. 4 mm distal left ureteral calculus without significant hydronephrosis or hydroureter. 2. Two additional nonobstructing left renal calculi measuring up to 7 mm. 3. Interval development of biconcave compression fracture of the T12 vertebral body. Sclerosis of the vertebral body may be due to healing change, however metastatic disease is difficult to completely exclude. Further evaluation with nonemergent contrast enhanced thoracic spine MRI should be performed.   Will send 5 day supply of tramadol and order MRI for further evaluation. Then likely referral to neurosurgery.   Sima Matas reviewed above with pt today in the office.

## 2022-11-29 LAB — HEPATIC FUNCTION PANEL
AG Ratio: 1.7 (calc) (ref 1.0–2.5)
ALT: 17 U/L (ref 9–46)
AST: 15 U/L (ref 10–35)
Albumin: 3.9 g/dL (ref 3.6–5.1)
Alkaline phosphatase (APISO): 90 U/L (ref 35–144)
Bilirubin, Direct: 0.1 mg/dL (ref 0.0–0.2)
Globulin: 2.3 g/dL (ref 1.9–3.7)
Indirect Bilirubin: 0.3 mg/dL (ref 0.2–1.2)
Total Bilirubin: 0.4 mg/dL (ref 0.2–1.2)
Total Protein: 6.2 g/dL (ref 6.1–8.1)

## 2022-11-29 LAB — LIPASE: Lipase: 30 U/L (ref 7–60)

## 2022-11-29 LAB — CBC WITH DIFFERENTIAL/PLATELET
Absolute Monocytes: 1058 {cells}/uL — ABNORMAL HIGH (ref 200–950)
Basophils Absolute: 176 {cells}/uL (ref 0–200)
Basophils Relative: 1.8 %
Eosinophils Absolute: 412 cells/uL (ref 15–500)
Eosinophils Relative: 4.2 %
HCT: 38 % — ABNORMAL LOW (ref 38.5–50.0)
Hemoglobin: 13.1 g/dL — ABNORMAL LOW (ref 13.2–17.1)
Lymphs Abs: 2323 {cells}/uL (ref 850–3900)
MCH: 32.1 pg (ref 27.0–33.0)
MCHC: 34.5 g/dL (ref 32.0–36.0)
MCV: 93.1 fL (ref 80.0–100.0)
MPV: 9.8 fL (ref 7.5–12.5)
Monocytes Relative: 10.8 %
Neutro Abs: 5831 {cells}/uL (ref 1500–7800)
Neutrophils Relative %: 59.5 %
Platelets: 268 10*3/uL (ref 140–400)
RBC: 4.08 10*6/uL — ABNORMAL LOW (ref 4.20–5.80)
RDW: 13.2 % (ref 11.0–15.0)
Total Lymphocyte: 23.7 %
WBC: 9.8 10*3/uL (ref 3.8–10.8)

## 2022-11-30 ENCOUNTER — Telehealth: Payer: Self-pay | Admitting: Family

## 2022-11-30 DIAGNOSIS — D649 Anemia, unspecified: Secondary | ICD-10-CM

## 2022-11-30 DIAGNOSIS — N200 Calculus of kidney: Secondary | ICD-10-CM

## 2022-11-30 MED ORDER — TAMSULOSIN HCL 0.4 MG PO CAPS: 0.4000 mg | ORAL_CAPSULE | Freq: Every day | ORAL | 0 refills | Status: DC

## 2022-11-30 NOTE — Telephone Encounter (Signed)
See mychart.  

## 2022-11-30 NOTE — Telephone Encounter (Signed)
Dr. Abner Greenspan- [place] Childes, please see mychart message. tks

## 2022-12-01 DIAGNOSIS — D649 Anemia, unspecified: Secondary | ICD-10-CM | POA: Insufficient documentation

## 2022-12-05 ENCOUNTER — Telehealth: Payer: Self-pay | Admitting: Family

## 2022-12-05 NOTE — Telephone Encounter (Signed)
Opened in error

## 2022-12-10 ENCOUNTER — Ambulatory Visit (HOSPITAL_BASED_OUTPATIENT_CLINIC_OR_DEPARTMENT_OTHER)
Admission: RE | Admit: 2022-12-10 | Discharge: 2022-12-10 | Disposition: A | Discharge status: Home / Self Care | Payer: 59 | Source: Ambulatory Visit | Attending: Family | Admitting: Family

## 2022-12-10 DIAGNOSIS — S22080A Wedge compression fracture of T11-T12 vertebra, initial encounter for closed fracture: Secondary | ICD-10-CM | POA: Insufficient documentation

## 2022-12-15 ENCOUNTER — Telehealth: Payer: Self-pay | Admitting: Family

## 2022-12-15 DIAGNOSIS — S22080A Wedge compression fracture of T11-T12 vertebra, initial encounter for closed fracture: Secondary | ICD-10-CM

## 2022-12-15 NOTE — Telephone Encounter (Signed)
Please advise pt that I reviewed his MRI.  MRI shows a straight forward fracture of his vertebra (no concerning findings). I will place a referral to neurosurgery to be evaluated for possible cementing procedure to support the vertebra.  How is his pain?

## 2022-12-15 NOTE — Telephone Encounter (Signed)
Patient reports pain is about the same.  He was notified of results and referral.

## 2023-08-06 ENCOUNTER — Telehealth: Admitting: Physician Assistant

## 2023-08-06 DIAGNOSIS — R062 Wheezing: Secondary | ICD-10-CM

## 2023-08-06 DIAGNOSIS — R0602 Shortness of breath: Secondary | ICD-10-CM

## 2023-08-06 NOTE — Progress Notes (Signed)
   Thank you for the details you included in the comment boxes. Those details are very helpful in determining the best course of treatment for you and help us  to provide the best care. Because you are having shortness of breath and wheezing, we recommend that you schedule a Virtual Urgent Care video visit in order for the provider to better assess what is going on and assess your breathing status.  The provider will be able to give you a more accurate diagnosis and treatment plan if we can more freely discuss your symptoms and with the addition of a virtual examination.   If you change your visit to a video visit, we will bill your insurance (similar to an office visit) and you will not be charged for this e-Visit. You will be able to stay at home and speak with the first available [facility] advanced practice provider. The link to do a video visit is in the drop down Menu tab of your Welcome screen in MyChart.        I have spent 5 minutes in review of e-visit questionnaire, review and updating patient chart, medical decision making and response to patient.   Angelia Kelp, PA-C

## 2024-02-19 NOTE — Progress Notes (Signed)
 Office Visit  PCP: No primary care provider on file.  Reason for Visit:   CAD  Assessment/Plan   1. Coronary arteriosclerosis in native artery ; LAD PCI 2011  2. Old myocardial infarction ; normal LV function  3. Pure hypercholesterolemia    PLAN:  continue present rx.  Check lipids and lipoprotein a today.  RTC 1 year.  Stop smoking year  Orders  No orders of the defined types were placed in this encounter.  Return in about 1 year (around 02/18/2025).  HPI   No cardiac complaints No cp or exercise intolerance Taking CV meds as prescribed Patient resumed smoking somewhat.  Told her we were very disappointed to hear that.  Otherwise he feels he is doing very well  Physical Examination:  VITAL SIGNS:  Vitals:   02/19/24 1424  BP: 145/80  Pulse: 78  SpO2: 96%   VSS Cardiac normal PV normal Carotids normal Chest clear No edema/jvd   HB CHEEK MD FACC  Past Medical History,  Past Surgical History, Family History, Social History, Medications, Allergies, Social History: As reviewed in EPIC  Review of Systems: All positive and pertinent negatives are noted in the HPI; otherwise all other systems are negative   Objective Data Reviewed During this Patient Encounter:    EKG today is shows sinus rhythm within normal limits   @ENCIMG30DAYS @    Lab Results  Component Value Date   CHOL 113 12/24/2022   TRIG 115 12/24/2022   HDL 37 (L) 12/24/2022   No results found for: WBC, HGB, HCT, PLT No results found for: NA, K, CL, CO2, BUN, CREATININE, GLU, AST, ALT, PT   Current Medications[1]      [1]  Current Outpatient Medications:  .  aspirin  81 mg EC tablet, Take  by mouth Once Daily., Disp: , Rfl:  .  atorvastatin  (LIPITOR) 40 mg tablet, Take 1 tablet by mouth once daily, Disp: 90 tablet, Rfl: 0 .  ezetimibe  (ZETIA ) 10 mg tablet, Take 1 tablet by mouth once daily, Disp: 30 tablet, Rfl: 0 .  lisinopriL  (PRINIVIL ) 10 mg tablet, Take 1  tablet by mouth once daily, Disp: 90 tablet, Rfl: 3 .  metoprolol  succinate (TOPROL  XL) 25 mg 24 hr tablet, Take 1 tablet by mouth once daily, Disp: 90 tablet, Rfl: 3 .  nitroglycerin  (NITROSTAT ) 0.4 mg SL tablet, Place 0.4 mg under the tongue., Disp: , Rfl:

## 2024-02-27 ENCOUNTER — Inpatient Hospital Stay (HOSPITAL_COMMUNITY)

## 2024-02-27 ENCOUNTER — Encounter (HOSPITAL_COMMUNITY): Admission: EM | Disposition: A | Payer: Self-pay | Source: Home / Self Care

## 2024-02-27 ENCOUNTER — Inpatient Hospital Stay (HOSPITAL_COMMUNITY): Admission: EM | Admit: 2024-02-27 | Discharge: 2024-02-28 | DRG: 321 | Disposition: A

## 2024-02-27 DIAGNOSIS — I2510 Atherosclerotic heart disease of native coronary artery without angina pectoris: Secondary | ICD-10-CM

## 2024-02-27 DIAGNOSIS — Z96641 Presence of right artificial hip joint: Secondary | ICD-10-CM | POA: Diagnosis not present

## 2024-02-27 DIAGNOSIS — K219 Gastro-esophageal reflux disease without esophagitis: Secondary | ICD-10-CM | POA: Diagnosis not present

## 2024-02-27 DIAGNOSIS — I2102 ST elevation (STEMI) myocardial infarction involving left anterior descending coronary artery: Secondary | ICD-10-CM | POA: Diagnosis not present

## 2024-02-27 DIAGNOSIS — E785 Hyperlipidemia, unspecified: Secondary | ICD-10-CM

## 2024-02-27 DIAGNOSIS — Z955 Presence of coronary angioplasty implant and graft: Secondary | ICD-10-CM

## 2024-02-27 DIAGNOSIS — I10 Essential (primary) hypertension: Secondary | ICD-10-CM | POA: Diagnosis not present

## 2024-02-27 DIAGNOSIS — R0989 Other specified symptoms and signs involving the circulatory and respiratory systems: Secondary | ICD-10-CM | POA: Diagnosis not present

## 2024-02-27 DIAGNOSIS — I213 ST elevation (STEMI) myocardial infarction of unspecified site: Principal | ICD-10-CM

## 2024-02-27 DIAGNOSIS — I214 Non-ST elevation (NSTEMI) myocardial infarction: Secondary | ICD-10-CM

## 2024-02-27 DIAGNOSIS — I4901 Ventricular fibrillation: Secondary | ICD-10-CM | POA: Diagnosis not present

## 2024-02-27 DIAGNOSIS — I2109 ST elevation (STEMI) myocardial infarction involving other coronary artery of anterior wall: Secondary | ICD-10-CM | POA: Diagnosis not present

## 2024-02-27 HISTORY — PX: CORONARY/GRAFT ACUTE MI REVASCULARIZATION: CATH118305

## 2024-02-27 LAB — CBC WITH DIFFERENTIAL/PLATELET
Abs Immature Granulocytes: 0.1 K/uL — ABNORMAL HIGH (ref 0.00–0.07)
Abs Immature Granulocytes: 0.08 K/uL — ABNORMAL HIGH (ref 0.00–0.07)
Basophils Absolute: 0.1 K/uL (ref 0.0–0.1)
Basophils Absolute: 0.1 K/uL (ref 0.0–0.1)
Basophils Relative: 1 %
Basophils Relative: 1 %
Eosinophils Absolute: 0.2 K/uL (ref 0.0–0.5)
Eosinophils Absolute: 0.2 K/uL (ref 0.0–0.5)
Eosinophils Relative: 1 %
Eosinophils Relative: 1 %
HCT: 40.9 % (ref 39.0–52.0)
HCT: 41.1 % (ref 39.0–52.0)
Hemoglobin: 13.8 g/dL (ref 13.0–17.0)
Hemoglobin: 13.7 g/dL (ref 13.0–17.0)
Immature Granulocytes: 1 %
Immature Granulocytes: 1 %
Lymphocytes Relative: 18 %
Lymphocytes Relative: 17 %
Lymphs Abs: 2.8 K/uL (ref 0.7–4.0)
Lymphs Abs: 2.5 K/uL (ref 0.7–4.0)
MCH: 31.8 pg (ref 26.0–34.0)
MCH: 31.5 pg (ref 26.0–34.0)
MCHC: 33.7 g/dL (ref 30.0–36.0)
MCHC: 33.3 g/dL (ref 30.0–36.0)
MCV: 94.2 fL (ref 80.0–100.0)
MCV: 94.5 fL (ref 80.0–100.0)
Monocytes Absolute: 1.7 K/uL — ABNORMAL HIGH (ref 0.1–1.0)
Monocytes Absolute: 1.6 K/uL — ABNORMAL HIGH (ref 0.1–1.0)
Monocytes Relative: 11 %
Monocytes Relative: 10 %
Neutro Abs: 10.5 K/uL — ABNORMAL HIGH (ref 1.7–7.7)
Neutro Abs: 10.5 K/uL — ABNORMAL HIGH (ref 1.7–7.7)
Neutrophils Relative %: 68 %
Neutrophils Relative %: 70 %
Platelets: 270 K/uL (ref 150–400)
Platelets: 244 K/uL (ref 150–400)
RBC: 4.34 MIL/uL (ref 4.22–5.81)
RBC: 4.35 MIL/uL (ref 4.22–5.81)
RDW: 13.5 % (ref 11.5–15.5)
RDW: 13.5 % (ref 11.5–15.5)
WBC: 15.4 K/uL — ABNORMAL HIGH (ref 4.0–10.5)
WBC: 15 K/uL — ABNORMAL HIGH (ref 4.0–10.5)
nRBC: 0 % (ref 0.0–0.2)
nRBC: 0 % (ref 0.0–0.2)

## 2024-02-27 LAB — POCT I-STAT EG7
Acid-Base Excess: 2 mmol/L (ref 0.0–2.0)
Bicarbonate: 26.6 mmol/L (ref 20.0–28.0)
Calcium, Ion: 1.17 mmol/L (ref 1.15–1.40)
HCT: 40 % (ref 39.0–52.0)
Hemoglobin: 13.6 g/dL (ref 13.0–17.0)
O2 Saturation: 57 %
Potassium: 4.5 mmol/L (ref 3.5–5.1)
Sodium: 137 mmol/L (ref 135–145)
TCO2: 28 mmol/L (ref 22–32)
pCO2, Ven: 41.3 mmHg — ABNORMAL LOW (ref 44–60)
pH, Ven: 7.417 (ref 7.25–7.43)
pO2, Ven: 29 mmHg — CL (ref 32–45)

## 2024-02-27 LAB — TYPE AND SCREEN
ABO/RH(D): A POS
Antibody Screen: NEGATIVE

## 2024-02-27 LAB — POCT I-STAT, CHEM 8
BUN: 29 mg/dL — ABNORMAL HIGH (ref 8–23)
BUN: 13 mg/dL (ref 8–23)
BUN: 25 mg/dL — ABNORMAL HIGH (ref 8–23)
Calcium, Ion: 1.17 mmol/L (ref 1.15–1.40)
Calcium, Ion: 0.44 mmol/L — CL (ref 1.15–1.40)
Calcium, Ion: 1.07 mmol/L — ABNORMAL LOW (ref 1.15–1.40)
Chloride: 102 mmol/L (ref 98–111)
Chloride: 116 mmol/L — ABNORMAL HIGH (ref 98–111)
Chloride: 69 mmol/L — ABNORMAL LOW (ref 98–111)
Creatinine, Ser: 1.4 mg/dL — ABNORMAL HIGH (ref 0.61–1.24)
Creatinine, Ser: 0.4 mg/dL — ABNORMAL LOW (ref 0.61–1.24)
Creatinine, Ser: 0.4 mg/dL — ABNORMAL LOW (ref 0.61–1.24)
Glucose, Bld: 119 mg/dL — ABNORMAL HIGH (ref 70–99)
Glucose, Bld: 63 mg/dL — ABNORMAL LOW (ref 70–99)
Glucose, Bld: 71 mg/dL (ref 70–99)
HCT: 40 % (ref 39.0–52.0)
HCT: 23 % — ABNORMAL LOW (ref 39.0–52.0)
HCT: 39 % (ref 39.0–52.0)
Hemoglobin: 13.6 g/dL (ref 13.0–17.0)
Hemoglobin: 13.3 g/dL (ref 13.0–17.0)
Hemoglobin: 7.8 g/dL — ABNORMAL LOW (ref 13.0–17.0)
Potassium: 4.4 mmol/L (ref 3.5–5.1)
Potassium: 2.1 mmol/L — CL (ref 3.5–5.1)
Potassium: 3 mmol/L — ABNORMAL LOW (ref 3.5–5.1)
Sodium: 137 mmol/L (ref 135–145)
Sodium: 103 mmol/L — CL (ref 135–145)
Sodium: 156 mmol/L — ABNORMAL HIGH (ref 135–145)
TCO2: 24 mmol/L (ref 22–32)
TCO2: 14 mmol/L — ABNORMAL LOW (ref 22–32)
TCO2: 18 mmol/L — ABNORMAL LOW (ref 22–32)

## 2024-02-27 LAB — COMPREHENSIVE METABOLIC PANEL WITH GFR
ALT: 31 U/L (ref 0–44)
ALT: 32 U/L (ref 0–44)
AST: 20 U/L (ref 15–41)
AST: 22 U/L (ref 15–41)
Albumin: 3.2 g/dL — ABNORMAL LOW (ref 3.5–5.0)
Albumin: 3.3 g/dL — ABNORMAL LOW (ref 3.5–5.0)
Alkaline Phosphatase: 66 U/L (ref 38–126)
Alkaline Phosphatase: 67 U/L (ref 38–126)
Anion gap: 11 (ref 5–15)
Anion gap: 8 (ref 5–15)
BUN: 26 mg/dL — ABNORMAL HIGH (ref 8–23)
BUN: 24 mg/dL — ABNORMAL HIGH (ref 8–23)
CO2: 23 mmol/L (ref 22–32)
CO2: 26 mmol/L (ref 22–32)
Calcium: 9.1 mg/dL (ref 8.9–10.3)
Calcium: 8.4 mg/dL — ABNORMAL LOW (ref 8.9–10.3)
Chloride: 102 mmol/L (ref 98–111)
Chloride: 99 mmol/L (ref 98–111)
Creatinine, Ser: 1.26 mg/dL — ABNORMAL HIGH (ref 0.61–1.24)
Creatinine, Ser: 1.34 mg/dL — ABNORMAL HIGH (ref 0.61–1.24)
GFR, Estimated: >60 mL/min (ref >60)
GFR, Estimated: 58 mL/min — ABNORMAL LOW (ref >60)
Glucose, Bld: 122 mg/dL — ABNORMAL HIGH (ref 70–99)
Glucose, Bld: 116 mg/dL — ABNORMAL HIGH (ref 70–99)
Potassium: 4.4 mmol/L (ref 3.5–5.1)
Potassium: 4.4 mmol/L (ref 3.5–5.1)
Sodium: 136 mmol/L (ref 135–145)
Sodium: 133 mmol/L — ABNORMAL LOW (ref 135–145)
Total Bilirubin: 0.5 mg/dL (ref 0.0–1.2)
Total Bilirubin: 0.9 mg/dL (ref 0.0–1.2)
Total Protein: 5.9 g/dL — ABNORMAL LOW (ref 6.5–8.1)
Total Protein: 5.9 g/dL — ABNORMAL LOW (ref 6.5–8.1)

## 2024-02-27 LAB — IRON AND TIBC
Iron: 22 ug/dL — ABNORMAL LOW (ref 45–182)
Saturation Ratios: 19 % (ref 17.9–39.5)
TIBC: 119 ug/dL — ABNORMAL LOW (ref 250–450)
UIBC: 97 ug/dL

## 2024-02-27 LAB — ECHOCARDIOGRAM COMPLETE
AR max vel: 3.35 cm2
AV Peak grad: 3.8 mmHg
Ao pk vel: 0.97 m/s
Area-P 1/2: 3.31 cm2
Height: 72 in
S' Lateral: 2.7 cm
Weight: 3174.62 [oz_av]

## 2024-02-27 LAB — LIPID PANEL
Cholesterol: 47 mg/dL (ref 0–200)
HDL: 21 mg/dL — ABNORMAL LOW (ref >40)
LDL Cholesterol: 23 mg/dL (ref 0–99)
Total CHOL/HDL Ratio: 2.2 ratio
Triglycerides: 16 mg/dL (ref <150)
VLDL: 3 mg/dL (ref 0–40)

## 2024-02-27 LAB — GLUCOSE, CAPILLARY: Glucose-Capillary: 107 mg/dL — ABNORMAL HIGH (ref 70–99)

## 2024-02-27 LAB — BRAIN NATRIURETIC PEPTIDE: B Natriuretic Peptide: 6 pg/mL (ref 0.0–100.0)

## 2024-02-27 LAB — PROTIME-INR
INR: 1 (ref 0.8–1.2)
INR: 1 (ref 0.8–1.2)
Prothrombin Time: 13.3 s (ref 11.4–15.2)
Prothrombin Time: 13.8 s (ref 11.4–15.2)

## 2024-02-27 LAB — T4, FREE: Free T4: 1.19 ng/dL — ABNORMAL HIGH (ref 0.61–1.12)

## 2024-02-27 LAB — CG4 I-STAT (LACTIC ACID): Lactic Acid, Venous: 0.5 mmol/L (ref 0.5–1.9)

## 2024-02-27 LAB — TSH: TSH: 1.082 u[IU]/mL (ref 0.350–4.500)

## 2024-02-27 LAB — FERRITIN: Ferritin: 79 ng/mL (ref 24–336)

## 2024-02-27 LAB — PHOSPHORUS
Phosphorus: >30 mg/dL — ABNORMAL HIGH (ref 2.5–4.6)
Phosphorus: 4.2 mg/dL (ref 2.5–4.6)

## 2024-02-27 LAB — MRSA NEXT GEN BY PCR, NASAL: MRSA by PCR Next Gen: NOT DETECTED

## 2024-02-27 LAB — HEMOGLOBIN A1C
Hgb A1c MFr Bld: 5.7 % — ABNORMAL HIGH (ref 4.8–5.6)
Mean Plasma Glucose: 116.89 mg/dL

## 2024-02-27 LAB — HIV ANTIBODY (ROUTINE TESTING W REFLEX): HIV Screen 4th Generation wRfx: NONREACTIVE

## 2024-02-27 LAB — MAGNESIUM
Magnesium: 0.9 mg/dL — CL (ref 1.7–2.4)
Magnesium: 1.9 mg/dL (ref 1.7–2.4)

## 2024-02-27 LAB — POCT ACTIVATED CLOTTING TIME: Activated Clotting Time: 481 s

## 2024-02-27 SURGERY — CORONARY/GRAFT ACUTE MI REVASCULARIZATION
Anesthesia: LOCAL

## 2024-02-27 MED ORDER — ACETAMINOPHEN 325 MG PO TABS: 650.0000 mg | ORAL_TABLET | ORAL | Status: DC | PRN

## 2024-02-27 MED ORDER — MAGNESIUM SULFATE 2 GM/50ML IV SOLN
2.0000 g | Freq: Once | INTRAVENOUS | Status: AC
  Administered 2024-02-27: 2 g via INTRAVENOUS
  Filled 2024-02-27: qty 50

## 2024-02-27 MED ORDER — LIDOCAINE HCL (PF) 1 % IJ SOLN
INTRAMUSCULAR | Status: DC | PRN
  Administered 2024-02-27: 2 mL

## 2024-02-27 MED ORDER — ONDANSETRON HCL 4 MG/2ML IJ SOLN: 4.0000 mg | Freq: Four times a day (QID) | INTRAMUSCULAR | Status: DC | PRN

## 2024-02-27 MED ORDER — SODIUM CHLORIDE 0.9% FLUSH
3.0000 mL | Freq: Two times a day (BID) | INTRAVENOUS | Status: DC
  Administered 2024-02-27 – 2024-02-28 (×4): 3 mL via INTRAVENOUS

## 2024-02-27 MED ORDER — ATORVASTATIN CALCIUM 80 MG PO TABS
80.0000 mg | ORAL_TABLET | Freq: Every day | ORAL | Status: DC
  Administered 2024-02-27 – 2024-02-28 (×2): 80 mg via ORAL
  Filled 2024-02-27 (×2): qty 1

## 2024-02-27 MED ORDER — MIDAZOLAM HCL (PF) 2 MG/2ML IJ SOLN
INTRAMUSCULAR | Status: DC | PRN
  Administered 2024-02-27: 2 mg via INTRAVENOUS

## 2024-02-27 MED ORDER — TICAGRELOR 90 MG PO TABS
ORAL_TABLET | ORAL | Status: DC | PRN
  Administered 2024-02-27: 180 mg via ORAL

## 2024-02-27 MED ORDER — DOCUSATE SODIUM 100 MG PO CAPS: 100.0000 mg | ORAL_CAPSULE | Freq: Two times a day (BID) | ORAL | Status: DC | PRN

## 2024-02-27 MED ORDER — SODIUM CHLORIDE 0.9 % IV SOLN: 250.0000 mL | INTRAVENOUS | Status: AC | PRN

## 2024-02-27 MED ORDER — VERAPAMIL HCL 2.5 MG/ML IV SOLN
INTRAVENOUS | Status: DC | PRN
  Administered 2024-02-27: 10 mL via INTRA_ARTERIAL

## 2024-02-27 MED ORDER — METOPROLOL SUCCINATE ER 25 MG PO TB24
25.0000 mg | ORAL_TABLET | Freq: Every morning | ORAL | Status: DC
  Filled 2024-02-27: qty 1

## 2024-02-27 MED ORDER — FREE WATER
250.0000 mL | Freq: Once | Status: AC
  Administered 2024-02-27: 250 mL via ORAL

## 2024-02-27 MED ORDER — POLYETHYLENE GLYCOL 3350 17 G PO PACK: 17.0000 g | PACK | Freq: Every day | ORAL | Status: DC | PRN

## 2024-02-27 MED ORDER — HEPARIN SODIUM (PORCINE) 1000 UNIT/ML IJ SOLN
INTRAMUSCULAR | Status: DC | PRN
  Administered 2024-02-27: 10000 [IU] via INTRAVENOUS

## 2024-02-27 MED ORDER — LIDOCAINE HCL (PF) 1 % IJ SOLN
INTRAMUSCULAR | Status: AC
  Filled 2024-02-27: qty 30

## 2024-02-27 MED ORDER — EZETIMIBE 10 MG PO TABS
10.0000 mg | ORAL_TABLET | Freq: Every day | ORAL | Status: DC
  Administered 2024-02-27 – 2024-02-28 (×2): 10 mg via ORAL
  Filled 2024-02-27 (×2): qty 1

## 2024-02-27 MED ORDER — FAMOTIDINE 20 MG PO TABS
20.0000 mg | ORAL_TABLET | Freq: Two times a day (BID) | ORAL | Status: DC
  Administered 2024-02-27 – 2024-02-28 (×3): 20 mg via ORAL
  Filled 2024-02-27 (×3): qty 1

## 2024-02-27 MED ORDER — LISINOPRIL 10 MG PO TABS
5.0000 mg | ORAL_TABLET | Freq: Every morning | ORAL | Status: DC
  Administered 2024-02-27 – 2024-02-28 (×2): 5 mg via ORAL
  Filled 2024-02-27 (×2): qty 1

## 2024-02-27 MED ORDER — ENOXAPARIN SODIUM 40 MG/0.4ML IJ SOSY
40.0000 mg | PREFILLED_SYRINGE | INTRAMUSCULAR | Status: DC
  Administered 2024-02-28: 40 mg via SUBCUTANEOUS
  Filled 2024-02-27: qty 0.4

## 2024-02-27 MED ORDER — TICAGRELOR 90 MG PO TABS: 90.0000 mg | ORAL_TABLET | Freq: Two times a day (BID) | ORAL | Status: DC

## 2024-02-27 MED ORDER — TICAGRELOR 90 MG PO TABS
90.0000 mg | ORAL_TABLET | Freq: Two times a day (BID) | ORAL | Status: DC
  Administered 2024-02-27 – 2024-02-28 (×3): 90 mg via ORAL
  Filled 2024-02-27 (×3): qty 1

## 2024-02-27 MED ORDER — CHLORHEXIDINE GLUCONATE CLOTH 2 % EX PADS
6.0000 | MEDICATED_PAD | Freq: Every day | CUTANEOUS | Status: DC
  Administered 2024-02-27 – 2024-02-28 (×2): 6 via TOPICAL

## 2024-02-27 MED ORDER — SODIUM CHLORIDE 0.9% FLUSH: 3.0000 mL | INTRAVENOUS | Status: DC | PRN

## 2024-02-27 MED ORDER — METOPROLOL TARTRATE 12.5 MG HALF TABLET
12.5000 mg | ORAL_TABLET | Freq: Two times a day (BID) | ORAL | Status: DC
  Administered 2024-02-27: 12.5 mg via ORAL
  Filled 2024-02-27: qty 1

## 2024-02-27 MED ORDER — ASPIRIN 81 MG PO TBEC
81.0000 mg | DELAYED_RELEASE_TABLET | Freq: Every day | ORAL | Status: DC
  Administered 2024-02-27 – 2024-02-28 (×2): 81 mg via ORAL
  Filled 2024-02-27 (×2): qty 1

## 2024-02-27 MED ORDER — HEPARIN (PORCINE) IN NACL 1000-0.9 UT/500ML-% IV SOLN
INTRAVENOUS | Status: DC | PRN
  Administered 2024-02-27 (×2): 500 mL

## 2024-02-27 MED ORDER — VERAPAMIL HCL 2.5 MG/ML IV SOLN
INTRAVENOUS | Status: AC
  Filled 2024-02-27: qty 2

## 2024-02-27 MED ORDER — IOHEXOL 350 MG/ML SOLN
INTRAVENOUS | Status: DC | PRN
  Administered 2024-02-27: 90 mL

## 2024-02-27 MED ORDER — HEPARIN SODIUM (PORCINE) 1000 UNIT/ML IJ SOLN
INTRAMUSCULAR | Status: AC
  Filled 2024-02-27: qty 10

## 2024-02-27 MED ORDER — NITROGLYCERIN 0.4 MG SL SUBL
0.4000 mg | SUBLINGUAL_TABLET | SUBLINGUAL | Status: DC | PRN
  Administered 2024-02-27: 0.4 mg via SUBLINGUAL

## 2024-02-27 MED ORDER — MIDAZOLAM HCL 2 MG/2ML IJ SOLN
INTRAMUSCULAR | Status: AC
  Filled 2024-02-27: qty 2

## 2024-02-27 MED ORDER — METOPROLOL SUCCINATE ER 25 MG PO TB24: 25.0000 mg | ORAL_TABLET | Freq: Every morning | ORAL | Status: DC

## 2024-02-27 MED ORDER — LOSARTAN POTASSIUM 25 MG PO TABS: 25.0000 mg | ORAL_TABLET | Freq: Every day | ORAL | Status: DC

## 2024-02-27 SURGICAL SUPPLY — 12 items
BALLOON SAPPHIRE 2.5X12 (BALLOONS) IMPLANT
CATH INFINITI 5FR ANG PIGTAIL (CATHETERS) IMPLANT
CATH INFINITI JR4 5F (CATHETERS) IMPLANT
CATH VISTA GUIDE 6FR XBLD 3.5 (CATHETERS) IMPLANT
DEVICE RAD COMP TR BAND LRG (VASCULAR PRODUCTS) IMPLANT
GLIDESHEATH SLEND SS 6F .021 (SHEATH) IMPLANT
GUIDEWIRE INQWIRE 1.5J.035X260 (WIRE) IMPLANT
PACK CARDIAC CATHETERIZATION (CUSTOM PROCEDURE TRAY) ×2 IMPLANT
SET ATX-X65L (MISCELLANEOUS) IMPLANT
STENT SYNERGY XD 2.50X16 (Permanent Stent) IMPLANT
TUBING CIL FLEX 10 FLL-RA (TUBING) IMPLANT
WIRE ASAHI PROWATER 180CM (WIRE) IMPLANT

## 2024-02-27 NOTE — Discharge Instructions (Signed)

## 2024-02-27 NOTE — Progress Notes (Signed)
    Advanced Heart Failure Rounding Note  Cardiologist: None  Chief Complaint: STEMI Subjective:     Overall doing well, no further chest pain. Will restart home lisinopril , metoprolol  succinate. EF 50-55% by my read. BP and HR normal, no further ectopy. Observe 1 additional day and plan early discharge tomorrow.     Objective:   Weight Range: 90 kg Body mass index is 26.91 kg/m.   Vital Signs:   Temp:  [97.5 F (36.4 C)] 97.5 F (36.4 C) (12/06 0413) Pulse Rate:  [0-75] 66 (12/06 0902) Resp:  [13-30] 18 (12/06 0800) BP: (119-151)/(55-97) 142/97 (12/06 1035) SpO2:  [93 %-99 %] 93 % (12/06 0800) Weight:  [90 kg-94.8 kg] 90 kg (12/06 0413) Last BM Date : 02/26/24  Weight change: Filed Weights   02/27/24 0300 02/27/24 0413  Weight: 94.8 kg 90 kg    Intake/Output:   Intake/Output Summary (Last 24 hours) at 02/27/2024 1425 Last data filed at 02/27/2024 0800 Gross per 24 hour  Intake 300 ml  Output 350 ml  Net -50 ml      Physical Exam    GENERAL: NAD, well appearing PULM:  Normal work of breathing, CTAB CARDIAC:  JVP: flat         Normal rate with regular rhythm. No murmurs, rubs or gallops.  No edema. Warm and well perfused extremities. ABDOMEN: Soft, non-tender, non-distended. NEUROLOGIC: Patient is oriented x3 with no focal or lateralizing neurologic deficits.    Telemetry   Sinus  Medications:     Scheduled Medications:  aspirin  EC  81 mg Oral Daily   atorvastatin   80 mg Oral Daily   Chlorhexidine  Gluconate Cloth  6 each Topical Daily   [START ON 02/28/2024] enoxaparin  (LOVENOX ) injection  40 mg Subcutaneous Q24H   ezetimibe   10 mg Oral Daily   famotidine   20 mg Oral BID   lisinopril   5 mg Oral q morning   [START ON 02/28/2024] metoprolol  succinate  25 mg Oral q morning   sodium chloride  flush  3 mL Intravenous Q12H   ticagrelor   90 mg Oral BID    Infusions:  sodium chloride       PRN Medications: sodium chloride , acetaminophen , docusate  sodium, nitroGLYCERIN , ondansetron  (ZOFRAN ) IV, polyethylene glycol, sodium chloride  flush    Patient Profile   This is a 68 year old gentleman with known history of CAD status post prior stenting who presents for STEMI and found to have diagonal disease.  Assessment/Plan    STEMI: Diagonal infarct, s/p successful PCI.  - Continue aspirin /ticagrelor  - Continue lipitor 80mg  daily - Start lisinopril  5mg  daily - Start metoprolol  succinate 25mg  daily - Zetia  10mg  daily - Smoking cessation encouraged - TTE reviewed - Risk stratification studies - D/C in AM  HLD:  - Statin and zetia  as above  HTN:  - Restarting home meds  Medication concerns reviewed with patient and pharmacy team. Barriers identified: DAPT  Length of Stay: 0  Morene JINNY Brownie, MD  02/27/2024, 2:25 PM  Advanced Heart Failure Team Pager [phone] (M-F; 7a - 5p)  Please contact [facility] for night-coverage after hours (5p -7a ) and weekends on [email or web address]

## 2024-02-27 NOTE — Progress Notes (Deleted)
 Overall doing well, no further chest pain. Will restart home lisinopril , metoprolol  succinate. EF 50-55% by my read. BP and HR normal, no further ectopy. Observe 1 additional day and plan early discharge tomorrow.

## 2024-02-27 NOTE — Care Management (Signed)
 Transition of Care [facility]) - Inpatient Brief Assessment   Patient Details  Name: Darryl Ramsey MRN: [id number] Date of Birth: 1955/12/15  Transition of [facility]) CM/SW Contact:    Corean JAYSON Canary, RN Phone Number: 02/27/2024, 2:45 PM   Clinical Narrative:  Presented with MI possible STEMI.. Will need cardiology and PCP follow up. No DME HH needs identified. Referral done by cardiac rehab for CRP2    Transition of Care Asessment: Insurance and Status: Insurance coverage has been reviewed Patient has primary care physician: Yes Home environment has been reviewed: Lives with spouse Prior level of function:: Independent Prior/Current Home Services: No current home services Social Drivers of Health Review: SDOH reviewed no interventions necessary Readmission risk has been reviewed: Yes Transition of care needs: no transition of care needs at this time

## 2024-02-27 NOTE — H&P (Signed)
 Cardiology Admission History and Physical   Patient ID: [name] MRN: [id number]; DOB: [birth year]   Admission date: 02/27/2024  PCP:  Domenica Harlene LABOR, MD   [facility] Providers Cardiologist:  None    Chief Complaint: Chest pain  Patient Profile: Darryl Ramsey is a 68 y.o. male with past medical history significant for CAD complicated by anterior MI in 2011 status post PCI/DES to LAD, hypertension, dyslipidemia, active smoking, GERD who is being seen 02/27/2024 for the evaluation of chest pain as part of STEMI activation.  History of Present Illness: Darryl Ramsey developed chest pain earlier in the night and called 911.  EMS arrived and ECG showed ST elevations in lead I, aVL and ST depressions with T wave inversions in lead III.  He was loaded with aspirin  324 mg, given sublingual nitro x 3, 10 mg of morphine , 4 mg of IV Zofran  and brought to [facility] ED.  Upon my evaluation, he was hemodynamically stable and complaining of chest pain 9 out of 10.  He noted that he has started smoking again recently.  He is not taking warfarin or DOAC and has not had bleeding issues in the past.  In talking to the interventional attending Dr. Jordan the decision was made to proceed to the Cath Lab.  Patient was in agreement with catheterization as well as blood products if needed.   Past Medical History:  Diagnosis Date   Arthritis    Coronary artery disease    GERD (gastroesophageal reflux disease)    History of chicken pox 10/06/2018   History of kidney stones    Hyperlipidemia    Hypertension    Myocardial infarct [facility]) 2011   Renal calculi    Renal disorder    left ureteral stone   Past Surgical History:  Procedure Laterality Date   APPENDECTOMY     BACK SURGERY  2007   lumbar, no hardware   CORONARY ANGIOPLASTY     stents    CORONARY STENT PLACEMENT  2011   patient says he had an MI and coronary stent 2011. No coronary problems since   CYSTOSCOPY WITH STENT  PLACEMENT  2006 and in 80's   TONSILLECTOMY     and adenoids   TOTAL HIP ARTHROPLASTY Right 12/30/2016   Procedure: RIGHT TOTAL HIP ARTHROPLASTY ANTERIOR APPROACH;  Surgeon: Ernie Cough, MD;  Location: [facility];  Service: Orthopedics;  Laterality: Right;  70 mins     Medications Prior to Admission: Prior to Admission medications   Medication Sig Start Date End Date Taking? Authorizing Provider  atorvastatin  (LIPITOR) 40 MG tablet TAKE 1 TABLET BY MOUTH ONCE DAILY IN THE MORNING 02/07/20   Domenica Harlene LABOR, MD  famotidine  (PEPCID ) 40 MG tablet Take 1 tablet by mouth once daily 03/08/21   Domenica Harlene LABOR, MD  lisinopril  (ZESTRIL ) 5 MG tablet TAKE 1 TABLET BY MOUTH IN THE MORNING 05/04/20   Domenica Harlene LABOR, MD  metoprolol  succinate (TOPROL -XL) 25 MG 24 hr tablet Take 1 tablet (25 mg total) by mouth every morning. 10/05/18   Domenica Harlene LABOR, MD  nitroGLYCERIN  (NITROSTAT ) 0.4 MG SL tablet Place 0.4 mg under the tongue every 5 (five) minutes as needed for chest pain.    [provider]  tamsulosin  (FLOMAX ) 0.4 MG CAPS capsule Take 1 capsule (0.4 mg total) by mouth daily. 11/30/22   O'Sullivan, Melissa, NP     Allergies:   No Known Allergies  Social History:   Social History  Socioeconomic History   Marital status: Married    Spouse name: Not on file   Number of children: Not on file   Years of education: Not on file   Highest education level: 12th grade  Occupational History   Not on file  Tobacco Use   Smoking status: Former    Current packs/day: 0.00    Types: Cigarettes    Quit date: 2011    Years since quitting: 14.9   Smokeless tobacco: Former  Building Services Engineer status: Never Used  Substance and Sexual Activity   Alcohol use: Yes    Comment: rare   Drug use: No   Sexual activity: Not on file  Other Topics Concern   Not on file  Social History Narrative   Not on file   Social Drivers of Health   Financial Resource Strain: Low Risk  (09/26/2022)   Overall  Financial Resource Strain (CARDIA)    Difficulty of Paying Living Expenses: Not hard at all  Food Insecurity: No Food Insecurity (09/26/2022)   Hunger Vital Sign    Worried About Running Out of Food in the Last Year: Never true    Ran Out of Food in the Last Year: Never true  Transportation Needs: No Transportation Needs (09/26/2022)   PRAPARE - Administrator, Civil Service (Medical): No    Lack of Transportation (Non-Medical): No  Physical Activity: Unknown (09/26/2022)   Exercise Vital Sign    Days of Exercise per Week: Patient declined    Minutes of Exercise per Session: Not on file  Stress: No Stress Concern Present (09/26/2022)   [employer] of Occupational Health - Occupational Stress Questionnaire    Feeling of Stress : Only a little  Social Connections: Unknown (09/26/2022)   Social Connection and Isolation Panel    Frequency of Communication with Friends and Family: Patient declined    Frequency of Social Gatherings with Friends and Family: Patient declined    Attends Religious Services: Patient declined    Database Administrator or Organizations: Patient declined    Attends Engineer, Structural: Not on file    Marital Status: Married  Catering Manager Violence: Not on file     Family History:   The patient's family history includes Cancer in his mother and paternal grandfather; Diabetes in his father; Gallstones in his brother; Heart disease in his father; Hyperlipidemia in his father; Hypertension in his father.    ROS:  Please see the history of present illness.  All other ROS reviewed and negative.     Physical Exam/Data: Vitals:   02/27/24 0300 02/27/24 0314  SpO2:  98%  Weight: 94.8 kg   Height: 6' (1.829 m)    No intake or output data in the 24 hours ending 02/27/24 0320    02/27/2024    3:00 AM 11/25/2022    4:06 PM 09/26/2022    2:43 PM  Last 3 Weights  Weight (lbs) 209 lb 213 lb 213 lb 6.4 oz  Weight (kg) 94.802 kg 96.616 kg 96.798 kg      Body mass index is 28.35 kg/m.  General:  Well nourished, well developed, in no acute distress HEENT: normal Neck: no JVD Vascular: No carotid bruits; Distal pulses 2+ bilaterally   Cardiac:  normal S1, S2; RRR; no murmur  Lungs:  clear to auscultation bilaterally, no wheezing, rhonchi or rales  Abd: soft, nontender, no hepatomegaly  Ext: no edema Musculoskeletal:  No deformities, BUE and BLE  strength normal and equal Skin: warm and dry  Neuro:  CNs 2-12 intact, no focal abnormalities noted Psych:  Normal affect   EKG:   Pending  Relevant CV Studies: Pending  Laboratory Data: High Sensitivity Troponin:  No results for input(s): TROPONINIHS in the last 720 hours.    Chemistry Recent Labs  Lab 02/27/24 0307 02/27/24 0308  NA 137 137  K 4.4 4.5  CL 102  --   GLUCOSE 119*  --   BUN 29*  --   CREATININE 1.40*  --     No results for input(s): PROT, ALBUMIN, AST, ALT, ALKPHOS, BILITOT in the last 168 hours. Lipids No results for input(s): CHOL, TRIG, HDL, LABVLDL, LDLCALC, CHOLHDL in the last 168 hours. Hematology Recent Labs  Lab 02/27/24 0308 02/27/24 0310  WBC  --  15.4*  RBC  --  4.34  HGB 13.6 13.8  HCT 40.0 40.9  MCV  --  94.2  MCH  --  31.8  MCHC  --  33.7  RDW  --  13.5  PLT  --  270   Thyroid  No results for input(s): TSH, FREET4 in the last 168 hours. BNPNo results for input(s): BNP, PROBNP in the last 168 hours.  DDimer No results for input(s): DDIMER in the last 168 hours.  Radiology/Studies:  No results found.   Assessment and Plan: This is a 68 year old gentleman with known history of CAD status post prior stenting who presents for STEMI activation.  # STEMI status post PCI/DES - Admit to cardiac ICU - Status post aspirin  load 324 mg - P2 Y12 loading per interventional team - Continue DAPT for 12 months - Metoprolol  titrate 12.5 mg twice daily - Atorvastatin  80 mg - Restart lisinopril  pending  function panel - Follow-up lipid panel, hemoglobin A1c, TSH/free T4, iron studies, proBNP - Please order TTE in the morning - Smoking cessation consult - Aggressive risk factor modification  Risk Assessment/Risk Scores:   TIMI Risk Score for ST  Elevation MI:   The patient's TIMI risk score is  , which indicates a  % risk of all cause mortality at 30 days.    Code Status: Full Code  Severity of Illness: The appropriate patient status for this patient is INPATIENT. Inpatient status is judged to be reasonable and necessary in order to provide the required intensity of service to ensure the patient's safety. The patient's presenting symptoms, physical exam findings, and initial radiographic and laboratory data in the context of their chronic comorbidities is felt to place them at high risk for further clinical deterioration. Furthermore, it is not anticipated that the patient will be medically stable for discharge from the hospital within 2 midnights of admission.   * I certify that at the point of admission it is my clinical judgment that the patient will require inpatient hospital care spanning beyond 2 midnights from the point of admission due to high intensity of service, high risk for further deterioration and high frequency of surveillance required.*  For questions or updates, please contact [facility] Please consult [email or web address] for contact info under     Signed, Donley LOISE Devonshire, MD  02/27/2024 3:20 AM

## 2024-02-27 NOTE — Progress Notes (Signed)
 eLink Physician-Brief Progress Note Patient Name: Darryl Ramsey DOB: [birth year] MRN: [id number]   Date of Service  02/27/2024  HPI/Events of Note  68 y.o. year-old male with a history of MI presenting to the ED with a STEMI status post PCI to the diagonal.  Previous mid LAD lesion PCI is patent.  V-fib requiring cardioversion during the cath.  Vitals, labs, and imaging reviewed.  EKG unavailable.  eICU Interventions  Dual antiplatelet therapy with Brilinta  and aspirin , statins, echocardiogram, nitro for pain and goal-directed medical therapy per cardiology.  Monitor for reperfusion associated arrhythmias.  DVT prophylaxis with enoxaparin  GI prophylaxis not indicated     Intervention Category Evaluation Type: New Patient Evaluation  Darryl Ramsey 02/27/2024, 4:18 AM

## 2024-02-27 NOTE — Progress Notes (Signed)
 CARDIAC REHAB PHASE I     Post MI/stent education including restrictions, risk factors, exercise guidelines, antiplatelet therapy importance, MI booklet, NTG use, heart healthy diet, smoking cessation and CRP2 reviewed. All questions and concerns addressed. Will refer to [facility] for CRP2.    [id number] Vaughn Asberry Hacking, RN BSN 02/27/2024 10:27 AM

## 2024-02-27 NOTE — Progress Notes (Signed)
   02/27/24 0800  Spiritual Encounters  Type of Visit Initial  Care provided to: Patient  Referral source Code page  OnCall Visit Yes   Chaplain was paged to Code STEMI. Chaplain made a follow up visit. The patient stated that he had heart attack while sleeping last night and that he hasn't experienced one since 2011. He reported that he is feeling well at the moment.  Chaplains remain available if needed.   M.Kubra Susanna Kerry Resident [phone]

## 2024-02-27 NOTE — ED Triage Notes (Addendum)
 Pt BIB [facility] arrived as an activated code STEMI. Pt called EMS for mid sternal pressure non radiating. Hx of MI 2011. 8/10 pain on arrival  EMS: 324mg  Aspirin  prior to EMS arrival  2 nitroglycerin  4mg  Zofran  10mg  morphine   100% 2L O2 62HR 140/82

## 2024-02-27 NOTE — ED Provider Notes (Signed)
 [facility] Emergency Department Provider Note MRN:  [id number]  Arrival date & time: 02/27/24     Chief Complaint   Code STEMI   History of Present Illness   Darryl Ramsey is a 68 y.o. year-old male with a history of MI presenting to the ED with chief complaint of code STEMI.  Patient arriving with chest pain and code STEMI called in the field by EMS.  Well-appearing on my assessment at the bridge.  Brief delay in the Cath Lab.  Review of Systems  A thorough review of systems was obtained and all systems are negative except as noted in the HPI and PMH.   Patient's Health History    Past Medical History:  Diagnosis Date   Arthritis    Coronary artery disease    GERD (gastroesophageal reflux disease)    History of chicken pox 10/06/2018   History of kidney stones    Hyperlipidemia    Hypertension    Myocardial infarct [facility]) 2011   Renal calculi    Renal disorder    left ureteral stone    Past Surgical History:  Procedure Laterality Date   APPENDECTOMY     BACK SURGERY  2007   lumbar, no hardware   CORONARY ANGIOPLASTY     stents    CORONARY STENT PLACEMENT  2011   patient says he had an MI and coronary stent 2011. No coronary problems since   CYSTOSCOPY WITH STENT PLACEMENT  2006 and in 80's   TONSILLECTOMY     and adenoids   TOTAL HIP ARTHROPLASTY Right 12/30/2016   Procedure: RIGHT TOTAL HIP ARTHROPLASTY ANTERIOR APPROACH;  Surgeon: Ernie Cough, MD;  Location: [facility];  Service: Orthopedics;  Laterality: Right;  70 mins    Family History  Problem Relation Age of Onset   Cancer Mother        breast with mets to brain   Diabetes Father    Hypertension Father    Heart disease Father    Hyperlipidemia Father    Gallstones Brother    Cancer Paternal Grandfather        lung    Social History   Socioeconomic History   Marital status: Married    Spouse name: Not on file   Number of children: Not on file   Years of education: Not  on file   Highest education level: 12th grade  Occupational History   Not on file  Tobacco Use   Smoking status: Former    Current packs/day: 0.00    Types: Cigarettes    Quit date: 2011    Years since quitting: 14.9   Smokeless tobacco: Former  Building Services Engineer status: Never Used  Substance and Sexual Activity   Alcohol use: Yes    Comment: rare   Drug use: No   Sexual activity: Not on file  Other Topics Concern   Not on file  Social History Narrative   Not on file   Social Drivers of Health   Financial Resource Strain: Low Risk  (09/26/2022)   Overall Financial Resource Strain (CARDIA)    Difficulty of Paying Living Expenses: Not hard at all  Food Insecurity: No Food Insecurity (09/26/2022)   Hunger Vital Sign    Worried About Running Out of Food in the Last Year: Never true    Ran Out of Food in the Last Year: Never true  Transportation Needs: No Transportation Needs (09/26/2022)   PRAPARE - Transportation  Lack of Transportation (Medical): No    Lack of Transportation (Non-Medical): No  Physical Activity: Unknown (09/26/2022)   Exercise Vital Sign    Days of Exercise per Week: Patient declined    Minutes of Exercise per Session: Not on file  Stress: No Stress Concern Present (09/26/2022)   [employer] of Occupational Health - Occupational Stress Questionnaire    Feeling of Stress : Only a little  Social Connections: Unknown (09/26/2022)   Social Connection and Isolation Panel    Frequency of Communication with Friends and Family: Patient declined    Frequency of Social Gatherings with Friends and Family: Patient declined    Attends Religious Services: Patient declined    Database Administrator or Organizations: Patient declined    Attends Engineer, Structural: Not on file    Marital Status: Married  Intimate Partner Violence: Not on file     Physical Exam   Vitals:   02/27/24 0314  SpO2: 98%    CONSTITUTIONAL: Well-appearing, NAD NEURO/PSYCH:   Alert and oriented x 3, no obvious focal deficits EYES:  eyes equal  ENT/NECK:  no LAD, no JVD CARDIO: Regular rate, well-perfused PULM: No increased respiratory effort GI/GU:  non-distended MSK/SPINE:  No gross deformities, no edema SKIN:  no rash, atraumatic   *Additional and/or pertinent findings included in MDM below  Diagnostic and Interventional Summary    EKG Interpretation Date/Time:    Ventricular Rate:    PR Interval:    QRS Duration:    QT Interval:    QTC Calculation:   R Axis:      Text Interpretation:         Labs Reviewed  CBC WITH DIFFERENTIAL/PLATELET - Abnormal; Notable for the following components:      Result Value   WBC 15.4 (*)    Neutro Abs 10.5 (*)    Monocytes Absolute 1.7 (*)    Abs Immature Granulocytes 0.10 (*)    All other components within normal limits  POCT I-STAT, CHEM 8 - Abnormal; Notable for the following components:   BUN 29 (*)    Creatinine, Ser 1.40 (*)    Glucose, Bld 119 (*)    All other components within normal limits  POCT I-STAT EG7 - Abnormal; Notable for the following components:   pCO2, Ven 41.3 (*)    pO2, Ven 29 (*)    All other components within normal limits  MRSA NEXT GEN BY PCR, NASAL  COMPREHENSIVE METABOLIC PANEL WITH GFR  PROTIME-INR  HIV ANTIBODY (ROUTINE TESTING W REFLEX)  COMPREHENSIVE METABOLIC PANEL WITH GFR  MAGNESIUM   BRAIN NATRIURETIC PEPTIDE  CBC WITH DIFFERENTIAL/PLATELET  PROTIME-INR  BLOOD GAS, ARTERIAL  PHOSPHORUS  I-STAT CHEM 8, ED  I-STAT VENOUS BLOOD GAS, ED  TYPE AND SCREEN    No orders to display    Medications  nitroGLYCERIN  (NITROSTAT ) SL tablet 0.4 mg (0 mg Sublingual Duplicate 02/27/24 0257)  Chlorhexidine  Gluconate Cloth 2 % PADS 6 each (has no administration in time range)  docusate sodium  (COLACE) capsule 100 mg (has no administration in time range)  polyethylene glycol (MIRALAX  / GLYCOLAX ) packet 17 g (has no administration in time range)  ondansetron  (ZOFRAN ) injection  4 mg (has no administration in time range)  famotidine  (PEPCID ) tablet 20 mg (has no administration in time range)  Heparin  (Porcine) in NaCl 1000-0.9 UT/500ML-% SOLN (500 mLs  Given 02/27/24 0314)     Procedures  /  Critical Care .Critical Care  Performed by: Theadore Ozell HERO,  MD Authorized by: Theadore Ozell HERO, MD   Critical care provider statement:    Critical care time (minutes):  15   Critical care was necessary to treat or prevent imminent or life-threatening deterioration of the following conditions: STEMI.   Critical care was time spent personally by me on the following activities:  Development of treatment plan with patient or surrogate, discussions with consultants, evaluation of patient's response to treatment, examination of patient, ordering and review of laboratory studies, ordering and review of radiographic studies, ordering and performing treatments and interventions, pulse oximetry, re-evaluation of patient's condition and review of old charts   ED Course and Medical Decision Making  Initial Impression and Ddx Prearrival EKG showing evidence of STEMI.  Plan was for patient to go directly to the Cath Lab but there is a slight delay.  He is well-appearing in no acute distress.  Cardiology at bedside.  Past medical/surgical history that increases complexity of ED encounter: MI in the past  Interpretation of Diagnostics Diagnostics pending  Patient Reassessment and Ultimate Disposition/Management     Quickly transferred to the Cath Lab.  Patient management required discussion with the following services or consulting groups:  Cardiology  Complexity of Problems Addressed Acute illness or injury that poses threat of life of bodily function  Additional Data Reviewed and Analyzed Further history obtained from: EMS on arrival  Additional Factors Impacting ED Encounter Risk Consideration of hospitalization  Ozell HERO. Theadore, MD [facility] Emergency Medicine [facility] [email or web address]  Final Clinical Impressions(s) / ED Diagnoses     ICD-10-CM   1. ST elevation myocardial infarction (STEMI), unspecified artery (HCC)  I21.3       ED Discharge Orders     None        Discharge Instructions Discussed with and Provided to Patient:   Discharge Instructions   None      Theadore Ozell HERO, MD 02/27/24 [phone]

## 2024-02-27 NOTE — Progress Notes (Signed)
 Echocardiogram 2D Echocardiogram has been performed.  Shatina Streets N Parris Cudworth,RDCS 02/27/2024, 8:46 AM

## 2024-02-28 ENCOUNTER — Encounter (HOSPITAL_COMMUNITY): Payer: Self-pay | Admitting: Cardiology

## 2024-02-28 ENCOUNTER — Other Ambulatory Visit (HOSPITAL_COMMUNITY): Payer: Self-pay

## 2024-02-28 MED ORDER — METOPROLOL SUCCINATE ER 25 MG PO TB24
12.5000 mg | ORAL_TABLET | Freq: Every morning | ORAL | 3 refills | Status: AC
  Filled 2024-02-28 – 2024-04-26 (×2): qty 45, 90d supply, fill #0

## 2024-02-28 MED ORDER — EZETIMIBE 10 MG PO TABS
10.0000 mg | ORAL_TABLET | Freq: Every day | ORAL | 3 refills | Status: DC
  Filled 2024-02-28: qty 90, 90d supply, fill #0

## 2024-02-28 MED ORDER — ATORVASTATIN CALCIUM 80 MG PO TABS
80.0000 mg | ORAL_TABLET | Freq: Every day | ORAL | 3 refills | Status: AC
  Filled 2024-02-28: qty 90, 90d supply, fill #0

## 2024-02-28 MED ORDER — TICAGRELOR 90 MG PO TABS
90.0000 mg | ORAL_TABLET | Freq: Two times a day (BID) | ORAL | 11 refills | Status: AC
  Filled 2024-02-28 – 2024-03-29 (×2): qty 60, 30d supply, fill #0
  Filled 2024-04-26: qty 60, 30d supply, fill #1

## 2024-02-28 MED ORDER — LISINOPRIL 5 MG PO TABS
5.0000 mg | ORAL_TABLET | Freq: Every day | ORAL | 3 refills | Status: AC
  Filled 2024-02-28: qty 90, 90d supply, fill #0

## 2024-02-28 MED ORDER — METOPROLOL SUCCINATE ER 25 MG PO TB24
12.5000 mg | ORAL_TABLET | Freq: Every morning | ORAL | Status: DC
  Administered 2024-02-28: 12.5 mg via ORAL

## 2024-02-28 MED ORDER — NITROGLYCERIN 0.4 MG SL SUBL
0.4000 mg | SUBLINGUAL_TABLET | SUBLINGUAL | 3 refills | Status: AC | PRN
  Filled 2024-02-28: qty 25, 3d supply, fill #0

## 2024-02-28 MED ORDER — TAMSULOSIN HCL 0.4 MG PO CAPS
0.4000 mg | ORAL_CAPSULE | Freq: Every day | ORAL | 1 refills | Status: AC | PRN
  Filled 2024-02-28: qty 30, 30d supply, fill #0

## 2024-02-28 NOTE — Discharge Summary (Signed)
 Advanced Heart Failure Team  Discharge Summary   Patient ID: Darryl Ramsey MRN: [id number], DOB/AGE: October 19, 1955 68 y.o. Admit date: 02/27/2024 D/C date:     02/28/2024   Primary Discharge Diagnoses:  STEMI  Secondary Discharge Diagnoses:  CAD, Tobacco abuse  Hospital Course:   Darryl Ramsey is a 68 y.o. male with past medical history significant for CAD complicated by anterior MI in 2011 status post PCI/DES to LAD, hypertension, dyslipidemia, active smoking, GERD who is being seen 02/27/2024 for the evaluation of chest pain as part of STEMI activation.   Lateral ST changes, taken to the cath lab emergently and found to have acutely occluded diagonal branch, treated with PCI. Prior LAD PCI patent. Intraprocedure ventricular arrhythmia, none on tele since.  Overall did well, no chest pain. Home BP medications restarted, normal echocardiogram. Will discharge with cardiology follow up. Smoking cessation advised, DAPT education provided. Discussed cardiac rehab.   Discharge Vitals: Blood pressure (!) 143/69, pulse 74, temperature 97.9 F (36.6 C), temperature source Oral, resp. rate 18, height 6' (1.829 m), weight 90 kg, SpO2 94%.  Labs: Lab Results  Component Value Date   WBC 15.0 (H) 02/27/2024   HGB 13.7 02/27/2024   HCT 41.1 02/27/2024   MCV 94.5 02/27/2024   PLT 244 02/27/2024    Recent Labs  Lab 02/27/24 0432  NA 133*  K 4.4  CL 99  CO2 26  BUN 24*  CREATININE 1.34*  CALCIUM  8.4*  PROT 5.9*  BILITOT 0.9  ALKPHOS 67  ALT 32  AST 22  GLUCOSE 116*   Lab Results  Component Value Date   CHOL 47 02/27/2024   HDL 21 (L) 02/27/2024   LDLCALC 23 02/27/2024   TRIG 16 02/27/2024   BNP (last 3 results) Recent Labs    02/27/24 0330  BNP 6.0    ProBNP (last 3 results) No results for input(s): PROBNP in the last 8760 hours.   Diagnostic Studies/Procedures   ECHOCARDIOGRAM COMPLETE Result Date: 02/27/2024    ECHOCARDIOGRAM REPORT   Patient Name:   Darryl Ramsey Date of Exam: 02/27/2024 Medical Rec #:  [id number]        Height:       72.0 in Accession #:    [id number]       Weight:       198.4 lb Date of Birth:  08/25/1955       BSA:          2.123 m Patient Age:    67 years         BP:           131/76 mmHg Patient Gender: M                HR:           63 bpm. Exam Location:  Inpatient Procedure: 2D Echo, 3D Echo, Color Doppler, Cardiac Doppler and Strain Analysis            (Both Spectral and Color Flow Doppler were utilized during            procedure). Indications:    Acute ischemic heart disease  History:        Patient has no prior history of Echocardiogram examinations. CAD                 and Previous Myocardial Infarction; Risk Factors:Hypertension,                 Dyslipidemia and  Former Smoker. Renal Disorder.  Sonographer:    Logan Shove RDCS Referring Phys: [id number] ADITYA PALIWAL IMPRESSIONS  1. Left ventricular ejection fraction, by estimation, is 55 to 60%. The left ventricle has normal function. The left ventricle has no regional wall motion abnormalities. Left ventricular diastolic parameters are consistent with Grade I diastolic dysfunction (impaired relaxation). The average left ventricular global longitudinal strain is -17.3 %. The global longitudinal strain is normal.  2. Right ventricular systolic function is normal. The right ventricular size is normal.  3. The mitral valve is normal in structure. No evidence of mitral valve regurgitation. No evidence of mitral stenosis.  4. The aortic valve is tricuspid. There is mild calcification of the aortic valve. There is mild thickening of the aortic valve. Aortic valve regurgitation is not visualized. Aortic valve sclerosis is present, with no evidence of aortic valve stenosis.  5. Aortic dilatation noted. There is mild dilatation of the ascending aorta, measuring 39 mm.  6. The inferior vena cava is normal in size with greater than 50% respiratory variability, suggesting right atrial pressure of 3  mmHg. FINDINGS  Left Ventricle: Left ventricular ejection fraction, by estimation, is 55 to 60%. The left ventricle has normal function. The left ventricle has no regional wall motion abnormalities. The average left ventricular global longitudinal strain is -17.3 %. Strain was performed and the global longitudinal strain is normal. The left ventricular internal cavity size was normal in size. There is no left ventricular hypertrophy. Left ventricular diastolic parameters are consistent with Grade I diastolic dysfunction (impaired relaxation). Right Ventricle: The right ventricular size is normal. No increase in right ventricular wall thickness. Right ventricular systolic function is normal. Left Atrium: Left atrial size was normal in size. Right Atrium: Right atrial size was normal in size. Pericardium: There is no evidence of pericardial effusion. Mitral Valve: The mitral valve is normal in structure. No evidence of mitral valve regurgitation. No evidence of mitral valve stenosis. Tricuspid Valve: The tricuspid valve is normal in structure. Tricuspid valve regurgitation is trivial. No evidence of tricuspid stenosis. Aortic Valve: The aortic valve is tricuspid. There is mild calcification of the aortic valve. There is mild thickening of the aortic valve. Aortic valve regurgitation is not visualized. Aortic valve sclerosis is present, with no evidence of aortic valve stenosis. Aortic valve peak gradient measures 3.8 mmHg. Pulmonic Valve: The pulmonic valve was normal in structure. Pulmonic valve regurgitation is not visualized. No evidence of pulmonic stenosis. Aorta: Aortic dilatation noted. There is mild dilatation of the ascending aorta, measuring 39 mm. Venous: The inferior vena cava is normal in size with greater than 50% respiratory variability, suggesting right atrial pressure of 3 mmHg. IAS/Shunts: No atrial level shunt detected by color flow Doppler. Additional Comments: 3D was performed not requiring image  post processing on an independent workstation and was normal.  LEFT VENTRICLE PLAX 2D LVIDd:         4.80 cm   Diastology LVIDs:         2.70 cm   LV e' medial:    5.11 cm/s LV PW:         1.00 cm   LV E/e' medial:  11.9 LV IVS:        1.00 cm   LV e' lateral:   9.25 cm/s LVOT diam:     2.10 cm   LV E/e' lateral: 6.6 LVOT Area:     3.46 cm  2D Longitudinal Strain                          2D Strain GLS Avg:     -17.3 %                           3D Volume EF:                          3D EF:        59 %                          LV EDV:       183 ml                          LV ESV:       74 ml                          LV SV:        109 ml RIGHT VENTRICLE             IVC RV Basal diam:  3.10 cm     IVC diam: 1.10 cm RV S prime:     10.60 cm/s TAPSE (M-mode): 2.7 cm      PULMONARY VEINS                             Diastolic Velocity: 27.20 cm/s                             S/D Velocity:       2.30                             Systolic Velocity:  61.80 cm/s LEFT ATRIUM             Index        RIGHT ATRIUM           Index LA diam:        3.70 cm 1.74 cm/m   RA Area:     17.20 cm LA Vol (A2C):   61.2 ml 28.82 ml/m  RA Volume:   50.30 ml  23.69 ml/m LA Vol (A4C):   41.3 ml 19.45 ml/m LA Biplane Vol: 50.3 ml 23.69 ml/m  AORTIC VALVE AV Area (Vmax): 3.35 cm AV Vmax:        97.00 cm/s AV Peak Grad:   3.8 mmHg LVOT Vmax:      93.80 cm/s  AORTA Ao Root diam: 3.70 cm Ao Asc diam:  3.90 cm MITRAL VALVE MV Area (PHT): 3.31 cm    SHUNTS MV Decel Time: 229 msec    Systemic Diam: 2.10 cm MV E velocity: 60.80 cm/s MV A velocity: 95.90 cm/s MV E/A ratio:  0.63 Maude Emmer MD Electronically signed by Maude Emmer MD Signature Date/Time: 02/27/2024/11:32:40 AM    Final    CARDIAC CATHETERIZATION Result Date: 02/27/2024   2nd Diag lesion is 100% stenosed.   Previously placed Mid LAD stent of unknown type is  widely patent.   A drug-eluting stent was successfully placed using a STENT SYNERGY XD 2.50X16.  Post intervention, there is a 0% residual stenosis.   Recommend uninterrupted dual antiplatelet therapy with Aspirin  81mg  daily and Ticagrelor  90mg  twice daily for a minimum of 12 months (ACS-Class I recommendation). Single vessel occlusive CAD involving the second diagonal. Patent stent in the mid LAD Normal LV function Elevated LVEDP 26 mm Hg Successful PCI of the second diagonal with DES Plan: DAPT for one year. Anticipate fast track DC    Discharge Medications   Allergies as of 02/28/2024   No Known Allergies      Medication List     STOP taking these medications    Advil 200 MG tablet Generic drug: ibuprofen   famotidine  40 MG tablet Commonly known as: PEPCID    Pepcid  AC Maximum Strength 20 MG tablet Generic drug: famotidine    predniSONE 5 MG tablet Commonly known as: DELTASONE   PriLOSEC OTC 20 MG tablet Generic drug: omeprazole   tiZANidine 2 MG tablet Commonly known as: ZANAFLEX       TAKE these medications    aspirin  EC 81 MG tablet Take 81 mg by mouth daily. Swallow whole.   atorvastatin  80 MG tablet Commonly known as: LIPITOR Take 1 tablet (80 mg total) by mouth daily. What changed:  medication strength how much to take when to take this   ezetimibe  10 MG tablet Commonly known as: ZETIA  Take 1 tablet (10 mg total) by mouth daily.   lisinopril  5 MG tablet Commonly known as: ZESTRIL  Take 1 tablet (5 mg total) by mouth daily. What changed:  medication strength how much to take   metoprolol  succinate 25 MG 24 hr tablet Commonly known as: TOPROL -XL Take 0.5 tablets (12.5 mg total) by mouth every morning. What changed: how much to take   nitroGLYCERIN  0.4 MG SL tablet Commonly known as: NITROSTAT  Place 1 tablet (0.4 mg total) under the tongue every 5 (five) minutes as needed for chest pain. What changed: when to take this   tamsulosin  0.4 MG Caps capsule Commonly known as: FLOMAX  Take 1 capsule (0.4 mg total) by mouth daily as needed (kidney  stone).   ticagrelor  90 MG Tabs tablet Commonly known as: BRILINTA  Take 1 tablet (90 mg total) by mouth 2 (two) times daily.        Disposition   The patient will be discharged in stable condition to home. Discharge Instructions     Amb Referral to Cardiac Rehabilitation   Complete by: As directed    Diagnosis:  STEMI Coronary Stents     After initial evaluation and assessments completed: Virtual Based Care may be provided alone or in conjunction with Phase 2 Cardiac Rehab based on patient barriers.: Yes   Intensive Cardiac Rehabilitation (ICR) [facility] location only OR Traditional Cardiac Rehabilitation (TCR) *If criteria for ICR are not met will enroll in TCR [facility] only): Yes   Diet - low sodium heart healthy   Complete by: As directed    Increase activity slowly   Complete by: As directed            Signed, Morene JINNY Brownie  02/28/2024, 10:56 AM  MD Duration of Discharge Encounter: 32 minutes

## 2024-02-28 NOTE — Progress Notes (Signed)
 Discharged to home with wife.  Ambulated in hallway w/o chestpain or SOB.  PIVs removed with dressings at site.  Awake, alert and oriented at time of discharge.  Discharge instructions discussed with patient and wife at bedside.  All questions and concerns addressed and answered including teach back of medication regimen.

## 2024-03-03 ENCOUNTER — Encounter (HOSPITAL_COMMUNITY): Payer: Self-pay

## 2024-03-03 ENCOUNTER — Telehealth (HOSPITAL_COMMUNITY): Payer: Self-pay

## 2024-03-03 LAB — HIGH SENSITIVITY CRP: CRP, High Sensitivity: 0.48 mg/L (ref 0.00–3.00)

## 2024-03-03 NOTE — Telephone Encounter (Signed)
 Called patient to see if he was interested in the Cardiac Rehab Program. Sent letter via My chart and LVMTCB

## 2024-03-04 LAB — LIPOPROTEIN A (LPA): Lipoprotein (a): 113.3 nmol/L — ABNORMAL HIGH (ref <75.0)

## 2024-03-08 NOTE — Progress Notes (Unsigned)
 Cardiology Office Note:    Date:  03/09/2024   ID:  Darryl Ramsey, DOB 05/12/1955, MRN [id number]  PCP:  Domenica Harlene LABOR, MD   [facility] Providers Cardiologist:  Darryle ONEIDA Decent, MD     Referring MD: Domenica Harlene LABOR, MD   Chief Complaint  Patient presents with   Follow-up    STEMI    History of Present Illness:    Darryl Ramsey is a 68 y.o. male with a hx of CAD with prior anterior MI treated with PCI/DES-LAD in 2011, hyperlipidemia, hypertension, tobacco abuse, and GERD.  He previously followed with cardiology at [facility] last seen on 02/19/2024.  He was admitted to [facility] 02/27/2024 with acute STEMI.  Emergent heart catheterization found to have 100% occlusion of the D2 and widely patent previously placed mid LAD stent.  Second diagonal was treated with 2.5 x 16 mm DES.  LVEDP was 26 mmHg.  Echocardiogram with LVEF 55-60%, grade 1 DD, no significant valvular disease.  Ascending aorta dilated to 39 mm.  He was treated with DAPT and discharged.  He presents today for cardiology follow-up. He continues to have DOE with walking long distances, but not while resting. No orthopnea, palpitations, LE edema, or chest pain. Doing well on DAPT. Struggling with sciatic nerve pain, prescribed gabapentin this morning and recommended for PT.    Past Medical History:  Diagnosis Date   Arthritis    Coronary artery disease    GERD (gastroesophageal reflux disease)    History of chicken pox 10/06/2018   History of kidney stones    Hyperlipidemia    Hypertension    Myocardial infarct [facility]) 2011   Renal calculi    Renal disorder    left ureteral stone    Past Surgical History:  Procedure Laterality Date   APPENDECTOMY     BACK SURGERY  2007   lumbar, no hardware   CORONARY ANGIOPLASTY     stents    CORONARY STENT PLACEMENT  2011   patient says he had an MI and coronary stent 2011. No coronary problems since   CORONARY/GRAFT ACUTE MI REVASCULARIZATION N/A 02/27/2024    Procedure: Coronary/Graft Acute MI Revascularization;  Surgeon: Jordan, Peter M, MD;  Location: [facility] INVASIVE CV LAB;  Service: Cardiovascular;  Laterality: N/A;   CYSTOSCOPY WITH STENT PLACEMENT  2006 and in 80's   TONSILLECTOMY     and adenoids   TOTAL HIP ARTHROPLASTY Right 12/30/2016   Procedure: RIGHT TOTAL HIP ARTHROPLASTY ANTERIOR APPROACH;  Surgeon: Ernie Cough, MD;  Location: [facility] ORS;  Service: Orthopedics;  Laterality: Right;  70 mins    Current Medications: Active Medications[1]   Allergies:   Patient has no known allergies.   Social History   Socioeconomic History   Marital status: Married    Spouse name: Not on file   Number of children: Not on file   Years of education: Not on file   Highest education level: 12th grade  Occupational History   Not on file  Tobacco Use   Smoking status: Former    Current packs/day: 0.00    Types: Cigarettes    Quit date: 2011    Years since quitting: 14.9   Smokeless tobacco: Former  Building Services Engineer status: Never Used  Substance and Sexual Activity   Alcohol use: Yes    Comment: rare   Drug use: No   Sexual activity: Not on file  Other Topics Concern   Not on file  Social History Narrative   Not on file   Social Drivers of Health   Tobacco Use: Medium Risk (03/09/2024)   Patient History    Smoking Tobacco Use: Former    Smokeless Tobacco Use: Former    Passive Exposure: Not on Actuary Strain: Low Risk (09/26/2022)   Overall Financial Resource Strain (CARDIA)    Difficulty of Paying Living Expenses: Not hard at all  Food Insecurity: No Food Insecurity (02/28/2024)   Epic    Worried About Programme Researcher, Broadcasting/film/video in the Last Year: Never true    Ran Out of Food in the Last Year: Never true  Transportation Needs: No Transportation Needs (02/28/2024)   Epic    Lack of Transportation (Medical): No    Lack of Transportation (Non-Medical): No  Physical Activity: Unknown (09/26/2022)   Exercise Vital Sign     Days of Exercise per Week: Patient declined    Minutes of Exercise per Session: Not on file  Stress: No Stress Concern Present (09/26/2022)   [employer] of Occupational Health - Occupational Stress Questionnaire    Feeling of Stress : Only a little  Social Connections: Moderately Isolated (02/28/2024)   Social Connection and Isolation Panel    Frequency of Communication with Friends and Family: More than three times a week    Frequency of Social Gatherings with Friends and Family: Twice a week    Attends Religious Services: Never    Database Administrator or Organizations: No    Attends Banker Meetings: Never    Marital Status: Married  Depression (PHQ2-9): Low Risk (11/25/2022)   Depression (PHQ2-9)    PHQ-2 Score: 0  Alcohol Screen: Low Risk (09/26/2022)   Alcohol Screen    Last Alcohol Screening Score (AUDIT): 2  Housing: Low Risk (02/28/2024)   Epic    Unable to Pay for Housing in the Last Year: No    Number of Times Moved in the Last Year: 0    Homeless in the Last Year: No  Utilities: Not At Risk (02/28/2024)   Epic    Threatened with loss of utilities: No  Health Literacy: Not on file     Family History: The patient's family history includes Cancer in his mother and paternal grandfather; Diabetes in his father; Gallstones in his brother; Heart disease in his father; Hyperlipidemia in his father; Hypertension in his father.  ROS:   Please see the history of present illness.     All other systems reviewed and are negative.  EKGs/Labs/Other Studies Reviewed:    The following studies were reviewed today:  EKG Interpretation Date/Time:  Wednesday March 09 2024 11:01:29 EST Ventricular Rate:  60 PR Interval:  200 QRS Duration:  80 QT Interval:  384 QTC Calculation: 384 R Axis:   71  Text Interpretation: Normal sinus rhythm Septal infarct , age undetermined When compared with ECG of 27-Feb-2024 04:22, ST no longer elevated in Lateral leads Nonspecific  T wave abnormality now evident in Lateral leads Confirmed by Madie Slough ([id number]) on 03/09/2024 11:09:03 AM    Recent Labs: 02/27/2024: ALT 32; B Natriuretic Peptide 6.0; BUN 24; Creatinine, Ser 1.34; Hemoglobin 13.7; Magnesium  1.9; Platelets 244; Potassium 4.4; Sodium 133; TSH 1.082  Recent Lipid Panel    Component Value Date/Time   CHOL 47 02/27/2024 0330   TRIG 16 02/27/2024 0330   HDL 21 (L) 02/27/2024 0330   CHOLHDL 2.2 02/27/2024 0330   VLDL 3 02/27/2024 0330   LDLCALC 23  02/27/2024 0330     Risk Assessment/Calculations:                Physical Exam:    VS:  BP 126/66 (BP Location: Left Arm, Patient Position: Sitting, Cuff Size: Normal)   Pulse 60   Ht 6' (1.829 m)   Wt 209 lb (94.8 kg)   SpO2 95%   BMI 28.35 kg/m     Wt Readings from Last 3 Encounters:  03/09/24 209 lb (94.8 kg)  02/27/24 198 lb 6.6 oz (90 kg)  11/25/22 213 lb (96.6 kg)     GEN:  Well nourished, well developed in no acute distress HEENT: Normal NECK: No JVD; No carotid bruits LYMPHATICS: No lymphadenopathy CARDIAC: RRR, no murmurs, rubs, gallops RESPIRATORY:  Clear to auscultation without rales, wheezing or rhonchi  ABDOMEN: Soft, non-tender, non-distended MUSCULOSKELETAL:  No edema; No deformity  SKIN: Warm and dry NEUROLOGIC:  Alert and oriented x 3 PSYCHIATRIC:  Normal affect  Right radial access site C/D/I  ASSESSMENT:    1. Hypertension, unspecified type   2. Coronary artery disease, unspecified vessel or lesion type, unspecified whether angina present, unspecified whether native or transplanted heart   3. Myocardial infarction, unspecified MI type, unspecified artery (HCC)   4. CAD S/P percutaneous coronary angioplasty   5. Elevated lipoprotein A level   6. Hyperlipidemia with target LDL less than 70   7. H/O tobacco use, presenting hazards to health    PLAN:    In order of problems listed above:  Recent STEMI treated with DES-D2 in 02/2024 CAD History of DES-LAD -  Remains on DAPT with aspirin  and Brilinta  - Continue 12.5 mg Toprol , 5 mg lisinopril , statin, Zetia  -- continue smoking cessation   Hypertension - Treated with 5 mg lisinopril  and 12.5 mg Toprol , continue - BP today normotensive -- will need to follow kidney function   Hyperlipidemia with LDL less than 55 - Continue 80 mg Lipitor, 10 mg Zetia  - LPA 113 - Needs PCSK9 inhibitor will refer to Pharm.D., can likely stop zetia  once started on PCSK9i   Tobacco abuse - has stopped smoking, congratulated on success   Back pain / sciatic nerve pain - can start gabapentin - can start PT, already walking 400 feet at work, works as a merchandiser, 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 Barbaraann.    Cardiac Rehabilitation Eligibility Assessment  The patient is ready to start cardiac rehabilitation from a cardiac standpoint.          Medication Adjustments/Labs and Tests Ordered: Current medicines are reviewed at length with the patient today.  Concerns regarding medicines are outlined above.  Orders Placed This Encounter  Procedures   AMB Referral to [facility] Pharm-D   EKG 12-Lead   No orders of the defined types were placed in this encounter.   Patient Instructions  Medication Instructions:  Your physician recommends that you continue on your current medications as directed. Please refer to the Current Medication list given to you today.  *If you need a refill on your cardiac medications before your next appointment, please call your pharmacy*  Lab Work: NONE ORDERED If you have labs (blood work) drawn today and your tests are completely normal, you will receive your results only by: MyChart Message (if you have MyChart) OR A paper copy in the mail If you have any lab test that is abnormal or we need to change your treatment, we will call you to review  the results.  Testing/Procedures: NONE ORDERED  Follow-Up: At [facility], you and your health  needs are our priority.  As part of our continuing mission to provide you with exceptional heart care, our providers are all part of one team.  This team includes your primary Cardiologist (physician) and Advanced Practice Providers or APPs (Physician Assistants and Nurse Practitioners) who all work together to provide you with the care you need, when you need it.  Your next appointment:    Pharm D appointment needed next available  3 months Dr. Barbaraann  We recommend signing up for the patient portal called MyChart.  Sign up information is provided on this After Visit Summary.  MyChart is used to connect with patients for Virtual Visits (Telemedicine).  Patients are able to view lab/test results, encounter notes, upcoming appointments, etc.  Non-urgent messages can be sent to your provider as well.   To learn more about what you can do with MyChart, go to [email or web address].              Signed, Jon Nat Hails, PA  03/09/2024 11:42 AM    [facility]     [1]  Current Meds  Medication Sig   aspirin  EC 81 MG tablet Take 81 mg by mouth daily. Swallow whole.   atorvastatin  (LIPITOR) 80 MG tablet Take 1 tablet (80 mg total) by mouth daily.   ezetimibe  (ZETIA ) 10 MG tablet Take 1 tablet (10 mg total) by mouth daily.   lisinopril  (ZESTRIL ) 5 MG tablet Take 1 tablet (5 mg total) by mouth daily.   metoprolol  succinate (TOPROL -XL) 25 MG 24 hr tablet Take 0.5 tablets (12.5 mg total) by mouth every morning.   nitroGLYCERIN  (NITROSTAT ) 0.4 MG SL tablet Place 1 tablet (0.4 mg total) under the tongue every 5 (five) minutes as needed for chest pain.   ticagrelor  (BRILINTA ) 90 MG TABS tablet Take 1 tablet (90 mg total) by mouth 2 (two) times daily.

## 2024-03-09 ENCOUNTER — Ambulatory Visit: Attending: Physician Assistant | Admitting: Physician Assistant

## 2024-03-09 ENCOUNTER — Encounter: Payer: Self-pay | Admitting: Physician Assistant

## 2024-03-09 VITALS — BP 126/66 | HR 60 | Ht 72.0 in | Wt 209.0 lb

## 2024-03-09 DIAGNOSIS — E7841 Elevated Lipoprotein(a): Secondary | ICD-10-CM | POA: Diagnosis not present

## 2024-03-09 DIAGNOSIS — Z9861 Coronary angioplasty status: Secondary | ICD-10-CM

## 2024-03-09 DIAGNOSIS — I219 Acute myocardial infarction, unspecified: Secondary | ICD-10-CM

## 2024-03-09 DIAGNOSIS — I2510 Atherosclerotic heart disease of native coronary artery without angina pectoris: Secondary | ICD-10-CM

## 2024-03-09 DIAGNOSIS — Z87891 Personal history of nicotine dependence: Secondary | ICD-10-CM

## 2024-03-09 DIAGNOSIS — I10 Essential (primary) hypertension: Secondary | ICD-10-CM | POA: Diagnosis not present

## 2024-03-09 DIAGNOSIS — E785 Hyperlipidemia, unspecified: Secondary | ICD-10-CM

## 2024-03-09 NOTE — Patient Instructions (Signed)
 Medication Instructions:  Your physician recommends that you continue on your current medications as directed. Please refer to the Current Medication list given to you today.  *If you need a refill on your cardiac medications before your next appointment, please call your pharmacy*  Lab Work: NONE ORDERED If you have labs (blood work) drawn today and your tests are completely normal, you will receive your results only by: MyChart Message (if you have MyChart) OR A paper copy in the mail If you have any lab test that is abnormal or we need to change your treatment, we will call you to review the results.  Testing/Procedures: NONE ORDERED  Follow-Up: At [facility], you and your health needs are our priority.  As part of our continuing mission to provide you with exceptional heart care, our providers are all part of one team.  This team includes your primary Cardiologist (physician) and Advanced Practice Providers or APPs (Physician Assistants and Nurse Practitioners) who all work together to provide you with the care you need, when you need it.  Your next appointment:    Pharm D appointment needed next available  3 months Dr. Barbaraann  We recommend signing up for the patient portal called MyChart.  Sign up information is provided on this After Visit Summary.  MyChart is used to connect with patients for Virtual Visits (Telemedicine).  Patients are able to view lab/test results, encounter notes, upcoming appointments, etc.  Non-urgent messages can be sent to your provider as well.   To learn more about what you can do with MyChart, go to [email or web address].

## 2024-03-29 ENCOUNTER — Other Ambulatory Visit (HOSPITAL_COMMUNITY): Payer: Self-pay

## 2024-03-29 ENCOUNTER — Other Ambulatory Visit (HOSPITAL_BASED_OUTPATIENT_CLINIC_OR_DEPARTMENT_OTHER): Payer: Self-pay

## 2024-04-15 ENCOUNTER — Telehealth (HOSPITAL_COMMUNITY): Payer: Self-pay

## 2024-04-15 NOTE — Telephone Encounter (Signed)
 Attempted to call patient regarding cardiac rehab- no answer, left message. Sent MyChart message.

## 2024-04-21 ENCOUNTER — Ambulatory Visit: Attending: Cardiovascular Disease

## 2024-04-21 ENCOUNTER — Telehealth: Payer: Self-pay

## 2024-04-21 DIAGNOSIS — E785 Hyperlipidemia, unspecified: Secondary | ICD-10-CM

## 2024-04-21 NOTE — Patient Instructions (Signed)
 Your Results:             Your most recent labs Goal  Total Cholesterol 47 < 200  Triglycerides 16 < 150  HDL (happy/good cholesterol) 21 > 40  LDL (lousy/bad cholesterol 23 < 55   Medication changes: Continue atorvastatin  80 mg daily and ezetimibe  10 mg daily Repatha/Praluent will replace ezetimibe . We will start the process to get Repatha/Praluent covered by your insurance.  Once the prior authorization is complete, we will call you to let you know and confirm pharmacy information.  Lab orders:We want to repeat labs 3 months after starting PCSK9i.  We will send you a lab order to remind        you once we get closer to that time.    Gracelynne Benedict E. Evely Gainey, Pharm.D, CPP [place] [facility]. [facility] [address], [place], [place] [address] Phone: [phone]; Fax: [phone]     Praluent is a cholesterol medication that improved your body's ability to get rid of bad cholesterol known as LDL. It can lower your LDL up to 60%. It is an injection that is given under the skin every 2 weeks. The most common side effects of Praluent include runny nose, symptoms of the common cold, rarely flu or flu-like symptoms, back/muscle pain in about 3-4% of the patients, and redness, pain, or bruising at the injection site.  Repatha is a cholesterol medication that improved your body's ability to get rid of bad cholesterol known as LDL. It can lower your LDL up to 60%! It is an injection that is given under the skin every 2 weeks. The medication often requires a prior authorization from your insurance company. We will take care of submitting all the necessary information to your insurance company to get it approved. The most common side effects of Repatha include runny nose, symptoms of the common cold, rarely flu or flu-like symptoms, back/muscle pain in about 3-4% of the patients, and redness, pain, or bruising at the injection site.   It is also recommended that  patients with high cholesterol adhere to a heart healthy diet, get regular exercise, avoid use of tobacco products, and maintain a healthy weight. Steps that you can take to help in these areas:  Limit consumption of trans fats, saturated fats, and cholesterol in your diet  Increase intake of lean meats such as chicken, turkey, and fish  Increase intake of foods rich in fiber such as fresh fruits, vegetables, beans and oatmeal Exercise as you are able; even 30 minutes of walking daily can aid in increasing heart health

## 2024-04-21 NOTE — Assessment & Plan Note (Addendum)
 Assessment:  LDL goal: < 55  mg/dl; last LDLc 23 mg/dl (87/7974) Elevated Lpa: 113 nmol/L (02/2024) Hx of recent STEMI (02/2024) and STEMI treated with PCI/DES-LAD in 2011 Tolerates Zetia  and high intensity statin well without any side effects   We discussed the possibility of enrolling in an Lp(a)-lowering clinical trial; however, his Lp(a) level is 113, which is below the typical eligibility thresholds of 175-200, making him ineligible for current trials. Discussed next potential options (PCSK-9 inhibitors); cost, dosing efficacy, side effects  Discussed that PCSK9 inhibitors may modestly lower Lp(a) with variable response, but they remain beneficial due to their proven cardiovascular risk-reduction, especially given the patients recent event  Plan: Continue taking atorvastatin  80 mg daily and ezetimibe  10 mg daily Will apply for PA for PCSK9i; will inform patient upon approval  PCSK9i will replace ezetimibe  once obtained Lipid lab and Lpa due in 3 months after starting PCSK9i

## 2024-04-21 NOTE — Progress Notes (Signed)
 Patient ID: Darryl Ramsey                 DOB: 01/29/1956                    MRN: [id number]      HPI: Darryl Ramsey is a 69 y.o. male patient referred to lipid clinic by Jon Hails, PA. PMH is significant for CAD with acute STEMI (02/2024) and prior anterior MI treated with PCI/DES-LAD in 2011, HLD, HTN and tobacco abuse.  Patient was last seen by cardiology provider in December 2025 for follow up appointment following recent acute STEMI (02/2024).  Emergent heart catheterization found to have 100% occlusion of the D2 and widely patent previously placed mid LAD stent. Second diagonal was treated with 2.5 x 16 mm DES. He was treated with DAPT and discharged.  Patient noted to have elevated Lpa (113) on 02/2024 and LDL (23) at goal of < 55 mg/dL. Patient referred to PharmD lipid clinic to discuss PCSK9i to likely replace ezetimibe .   Patient presents today accompanied by his wife for PharmD lipid clinic. Patient is currently taking atorvastatin  80 mg daily and ezetimibe  10 mg daily for HLD management. He reports tolerating current regimen well without any side effects. Most recent lipid panel shows LDL at goal on current therapy.   We discussed that there are currently no FDA-approved therapies that reliably and effectively lower Lp(a). I explained that PCSK9 inhibitors have been shown to produce approximately a 20% reduction in Lp(a), although clinical experience demonstrates variable responses, with some patients even experiencing increases. Despite this variability, PCSK9 inhibitors remain beneficial given their proven cardiovascular risk-reduction effects, which is particularly relevant in light of the patients recent cardiovascular event.  We discussed the possibility of enrolling in an Lp(a)-lowering clinical trial; however, his Lp(a) level is 113, which is below the typical eligibility thresholds of 175-200, making him ineligible for current trials.  We reviewed PCSK-9 inhibitors.  Discussed mechanisms of action, dosing, side effects and potential decreases in LDL/Lpa .  Also reviewed cost information.  Current Medications: atorvastatin  80 mg daily, ezetimibe  10 mg daily Intolerances: none Risk Factors: age, CAD with prior anterior MI treated with PCI/DES-LAD in 2011, HTN and tobacco abuse LDL goal: < 55 Lipid panel (02/2024): Chol 47, Trig 16, HDL 21, LDL 23 Lpa (02/2024): 113 nmol/L Liver enzymes (02/2024): AST 22, ALT 32, Alk phos 67  Diet: The patient acknowledges that his diet could be improved. He continues to work and often eats biscuits, fast food from [employer], and reports having a sweet tooth along with regular soda intake.  Breakfast:Biscuit and coffee  Lunch/Dinner: [employer] Green beans Corn Salads  Snacks: ice cream, cake  Beverages:Soda   Exercise: walking   Family History:  Relation Problem Comments  Mother - 85 (Deceased) Cancer breast with mets to brain    Father - 36 (Alive) Diabetes   Heart disease   Hyperlipidemia   Hypertension     Brother - Pharmacist, Hospital Metallurgist) Gallstones     Brother - 51,[name] (Alive)   Maternal Grandmother (Deceased)   Maternal Grandfather (Deceased)   Paternal Grandmother - 90s (Deceased)   Paternal Grandfather - 22s (Deceased) Cancer lung    Daughter - 47 (Alive)     Social History:  Alcohol: socially  Smoking: none, quit after most recent STEMI   Labs:  Lipid Panel     Component Value Date/Time   CHOL 47 02/27/2024 0330   TRIG 16 02/27/2024 0330  HDL 21 (L) 02/27/2024 0330   CHOLHDL 2.2 02/27/2024 0330   VLDL 3 02/27/2024 0330   LDLCALC 23 02/27/2024 0330    Past Medical History:  Diagnosis Date   Arthritis    Coronary artery disease    GERD (gastroesophageal reflux disease)    History of chicken pox 10/06/2018   History of kidney stones    Hyperlipidemia    Hypertension    Myocardial infarct [facility]) 2011   Renal calculi    Renal disorder    left ureteral stone    Medications  Ordered Prior to Encounter[1]  Allergies[2]  Assessment/Plan:  1. Hyperlipidemia -  Problem  Hyperlipidemia   Hyperlipidemia Assessment:  LDL goal: < 55  mg/dl; last LDLc 23 mg/dl (87/7974) Elevated Lpa: 113 nmol/L (02/2024) Hx of recent STEMI (02/2024) and STEMI treated with PCI/DES-LAD in 2011 Tolerates Zetia  and high intensity statin well without any side effects   We discussed the possibility of enrolling in an Lp(a)-lowering clinical trial; however, his Lp(a) level is 113, which is below the typical eligibility thresholds of 175-200, making him ineligible for current trials. Discussed next potential options (PCSK-9 inhibitors); cost, dosing efficacy, side effects  Discussed that PCSK9 inhibitors may modestly lower Lp(a) with variable response, but they remain beneficial due to their proven cardiovascular risk-reduction, especially given the patients recent event  Plan: Continue taking atorvastatin  80 mg daily and ezetimibe  10 mg daily Will apply for PA for PCSK9i; will inform patient upon approval  PCSK9i will replace ezetimibe  once obtained Lipid lab and Lpa due in 3 months after starting PCSK9i    Thank you,  Keandra Medero E. Temia Debroux, Pharm.D, CPP [place] [facility]. [facility] [address], [place], [place] [address] Phone: [phone]; Fax: [phone]        [1]  Current Outpatient Medications on File Prior to Visit  Medication Sig Dispense Refill   aspirin  EC 81 MG tablet Take 81 mg by mouth daily. Swallow whole.     atorvastatin  (LIPITOR) 80 MG tablet Take 1 tablet (80 mg total) by mouth daily. 90 tablet 3   ezetimibe  (ZETIA ) 10 MG tablet Take 1 tablet (10 mg total) by mouth daily. 90 tablet 3   gabapentin (NEURONTIN) 300 MG capsule Take 300 mg by mouth 3 (three) times daily. (Patient not taking: Reported on 03/09/2024)     lisinopril  (ZESTRIL ) 5 MG tablet Take 1 tablet (5 mg total) by mouth daily. 90 tablet 3   metoprolol   succinate (TOPROL -XL) 25 MG 24 hr tablet Take 0.5 tablets (12.5 mg total) by mouth every morning. 45 tablet 3   nitroGLYCERIN  (NITROSTAT ) 0.4 MG SL tablet Place 1 tablet (0.4 mg total) under the tongue every 5 (five) minutes as needed for chest pain. 25 tablet 3   tamsulosin  (FLOMAX ) 0.4 MG CAPS capsule Take 1 capsule (0.4 mg total) by mouth daily as needed (kidney stone). (Patient not taking: Reported on 03/09/2024) 30 capsule 1   ticagrelor  (BRILINTA ) 90 MG TABS tablet Take 1 tablet (90 mg total) by mouth 2 (two) times daily. 60 tablet 11   No current facility-administered medications on file prior to visit.  [2] No Known Allergies

## 2024-04-22 ENCOUNTER — Telehealth: Payer: Self-pay | Admitting: Pharmacy Technician

## 2024-04-22 ENCOUNTER — Other Ambulatory Visit (HOSPITAL_COMMUNITY): Payer: Self-pay

## 2024-04-22 NOTE — Telephone Encounter (Signed)
 Ran test claim for repatha. For a 28 day supply and the co-pay is 40.00 . PA is not needed at this time. This test claim was processed through [facility]- copay amounts may vary at other pharmacies due to pharmacy/plan contracts, or as the patient moves through the different stages of their insurance plan.

## 2024-04-25 MED ORDER — REPATHA SURECLICK 140 MG/ML ~~LOC~~ SOAJ: 140.0000 mg | SUBCUTANEOUS | 3 refills | Status: AC

## 2024-04-25 NOTE — Telephone Encounter (Addendum)
 Called patient to discuss Repatha  copay. NR-LVM requesting call back. No DPR on file. Will go ahead and send rx to preferred pharmacy on file and send mychart message.

## 2024-04-25 NOTE — Telephone Encounter (Signed)
 Please see other encounter.

## 2024-04-26 ENCOUNTER — Other Ambulatory Visit (HOSPITAL_BASED_OUTPATIENT_CLINIC_OR_DEPARTMENT_OTHER): Payer: Self-pay

## 2024-06-09 ENCOUNTER — Ambulatory Visit: Admitting: Cardiovascular Disease
